# Patient Record
Sex: Male | Born: 1960 | Race: Black or African American | Hispanic: No | Marital: Married
Health system: Midwestern US, Community
[De-identification: ages and names within clinical notes are randomized; demographics above are authoritative.]

## PROBLEM LIST (undated history)

## (undated) DIAGNOSIS — Z1211 Encounter for screening for malignant neoplasm of colon: Secondary | ICD-10-CM

## (undated) DIAGNOSIS — Z7689 Persons encountering health services in other specified circumstances: Secondary | ICD-10-CM

## (undated) DIAGNOSIS — G4733 Obstructive sleep apnea (adult) (pediatric): Secondary | ICD-10-CM

---

## 2009-10-08 NOTE — Progress Notes (Signed)
HISTORY OF PRESENT ILLNESS  Alejandro Cohen is a 49 y.o. male.  HPI  Introductory visit  Wants physical not seen Doctor in 4.5 years  Past Medical History   Diagnosis Date   ??? Contact dermatitis and other eczema, due to unspecified cause      acne         Family History   Problem Relation Age of Onset   ??? Diabetes Mother    ??? Elevated Lipids Mother    ??? Migraines Mother    ??? Heart Disease Mother    ??? Hypertension Mother    ??? Psychiatric Disorder Mother    ??? Stroke Mother    ??? Alcohol abuse Father    ??? Diabetes Father    ??? Elevated Lipids Father    ??? Heart Disease Father    ??? Hypertension Father    ??? Stroke Father    ??? Alcohol abuse Maternal Grandmother    ??? Alcohol abuse Maternal Grandfather    ??? Alcohol abuse Paternal Grandmother    ??? Alcohol abuse Paternal Grandfather        History   Social History   ??? Marital Status: Divorced     Spouse Name: N/A     Number of Children: N/A   ??? Years of Education: N/A   Occupational History   ??? Not on file.   Social History Main Topics   ??? Smoking status: Former Smoker     Quit date: 10/09/1987   ??? Smokeless tobacco: Never Used   ??? Alcohol Use: No   ??? Drug Use: No   ??? Sexually Active: Not on file   Other Topics Concern   ??? Not on file   Social History Narrative   ??? No narrative on file     works out  [employer] well      No complaints  Review of Systems   Constitutional: Negative.  Negative for fever and malaise/fatigue.   HENT: Negative.    Eyes: Negative.    Respiratory: Negative for cough and shortness of breath.    Cardiovascular: Negative for chest pain, palpitations, orthopnea and leg swelling.   Gastrointestinal: Negative.  Negative for diarrhea.   Genitourinary: Negative.    Musculoskeletal: Negative.  Negative for myalgias.   Skin: Negative.    Neurological: Negative.  Negative for dizziness and tremors.   Endo/Heme/Allergies: Does not bruise/bleed easily.   All other systems reviewed and are negative.        Physical Exam   Vitals reviewed.   Constitutional: He is oriented to person, place, and time. He appears well-developed and well-nourished.   HENT:   Head: Normocephalic.   Eyes: Pupils are equal, round, and reactive to light.   Neck: Normal range of motion. Neck supple. No thyromegaly present.   Cardiovascular: Normal rate, regular rhythm, normal heart sounds and intact distal pulses.    No murmur heard.  Pulmonary/Chest: Effort normal and breath sounds normal. No respiratory distress. He has no wheezes. He exhibits no tenderness.   Abdominal: Soft. Bowel sounds are normal. He exhibits no mass.   Genitourinary: Prostate normal.   Musculoskeletal: He exhibits no edema and no tenderness.   Neurological: He is alert and oriented to person, place, and time. Coordination normal.   Skin: Skin is warm and dry.   Psychiatric: He has a normal mood and affect. His behavior is normal. Judgment and thought content normal.       ASSESSMENT and PLAN  Cary was seen today  for new patient.    Diagnoses and associated orders for this visit:    Elevated blood pressure reading without diagnosis of hypertension  - METABOLIC PANEL, COMPREHENSIVE; Future  - LIPID PANEL; Future  - CRP, HIGH SENSITIVITY; Future    General medical examination  - CBC WITH AUTOMATED DIFF; Future  - PROSTATE SPECIFIC AG; Future  - HIV 2 AB; Future  - CRP, HIGH SENSITIVITY; Future        DOING VERY WELL IN GOOD HEALTH, ADVISE CONTINUED HEALTHY LIFESTYLE  CONTINUE SCREENING TESTING AS ADVISED

## 2009-10-08 NOTE — Patient Instructions (Addendum)
English   Spanish  A Healthy Lifestyle: After Your Visit  Your Care Instructions  A healthy lifestyle can help you feel good, stay at a healthy weight, and have plenty of energy for both work and play. A healthy lifestyle is something you can share with your whole family.  A healthy lifestyle also can lower your risk for serious health problems, such as high blood pressure, heart disease, and diabetes.  You can follow a few steps listed below to improve your health and the health of your family.  Follow-up care is a key part of your treatment and safety. Be sure to make and go to all appointments, and call your doctor if you are having problems. It???s also a good idea to know your test results and keep a list of the medicines you take.  How can you care for yourself at home?  ?? Do not eat too much sugar, fat, or fast foods. You can still have dessert and treats now and then. The goal is moderation.   ?? Start small to improve your eating habits. Pay attention to portion sizes, drink less juice and soda pop, and eat more fruits and vegetables.   ?? Eat a healthy amount of food. A 3-ounce serving of meat, for example, is about the size of a deck of cards. Fill the rest of your plate with vegetables and whole grains.   ?? Limit the amount of soda and sports drinks you have every day. Drink more water when you are thirsty.   ?? Eat at least 5 servings of fruits and vegetables every day. It may seem like a lot, but it is not hard to reach this goal. A serving or helping is 1 piece of fruit, 1 cup of vegetables, or 2 cups of leafy, raw vegetables. Have an apple or some carrot sticks as an afternoon snack instead of a candy bar. Try to have fruits and/or vegetables at every meal.   ?? Make exercise part of your daily routine. You may want to start with simple activities, such as walking, bicycling, or slow swimming. Try to be active 30 to 60 minutes every day. You do not need to do all 30 to 60 minutes all at once. For example, you can exercise 3 times a day for 10 or 20 minutes. Moderate exercise is safe for most people, but it is always a good idea to talk to your doctor before starting an exercise program.   ?? Keep moving. Mow the lawn, work in the garden, or clean your house. Take the stairs instead of the elevator at work.   ?? If you smoke, quit. People who smoke have an increased risk for heart attack, stroke, cancer, and other lung illnesses. Quitting is hard, but there are ways to boost your chance of quitting tobacco for good.   ?? Use nicotine gum, patches, or lozenges.   ?? Ask your doctor about stop-smoking programs and medicines.   ?? Keep trying.  In addition to reducing your risk of diseases in the future, you will notice some benefits soon after you stop using tobacco. If you have shortness of breath or asthma symptoms, they will likely get better within a few weeks after you quit.   ?? Limit how much alcohol you drink. Moderate amounts of alcohol (up to 2 drinks a day for men, 1 drink a day for women) are okay. But drinking too much can lead to liver problems, high blood pressure, and other health problems.    Family health  If you have a family, there are many things you can do together to improve your health.  ?? Eat meals together as a family as often as possible.   ?? Eat healthy foods. This includes fruits, vegetables, lean meats and dairy, and whole grains.   ?? Include your family in your fitness plan. Most people think of activities such as jogging or tennis as the way to fitness, but there are many ways you and your family can be more active. Anything that makes you breathe hard and gets your heart pumping is exercise. Here are some tips:    ?? Walk to do errands or to take your child to school or the bus.   ?? Go for a family bike ride after dinner instead of watching TV.       Where can you learn more?   Go to [facility]  Enter [phone] 669-8395 in the search box to learn more about "A Healthy Lifestyle: After Your Visit."   ?? 2006-2011 [employer]. Care instructions adapted under license by [employer] (which disclaims liability or warranty for this information). This care instruction is for use with your licensed healthcare professional. If you have questions about a medical condition or this instruction, always ask your healthcare professional. Healthwise, Inc[employer] disclaims any warranty or liability for your use of this information.  Content Version: 8.9.83828; Last Revised: January 30, 2009      Colonoscopy Referral to Dr Patsi Sears, Daiva Huge, or Genevie Cheshire at [phone]  or  Call them and mention Dr Janace Aris referring  English   Spanish  The DASH Diet: After Your Visit  Your Care Instructions  The DASH diet is an eating plan that can help lower your blood pressure. DASH stands for Dietary Approaches to Stop Hypertension. Hypertension is high blood pressure.  The DASH diet focuses on eating foods that are high in calcium, potassium, and magnesium. These nutrients can lower blood pressure. The foods that are highest in these nutrients are fruits, vegetables, low-fat dairy products, nuts, seeds, and legumes. But taking calcium, potassium, and magnesium supplements instead of eating foods that are high in those nutrients does not have the same effect. The DASH diet also includes whole grains, fish, and poultry.  The DASH diet is one of several lifestyle changes your doctor may recommend to lower your high blood pressure. Your doctor may also want you to decrease the amount of sodium in your diet. Lowering sodium while following the DASH diet can lower blood pressure even further than just the DASH diet alone.   Follow-up care is a key part of your treatment and safety. Be sure to make and go to all appointments, and call your doctor if you are having problems. It???s also a good idea to know your test results and keep a list of the medicines you take.  How can you care for yourself at home?  Following the DASH diet  ?? Eat 4 to 5 servings of fruit each day. A serving is 1 medium-sized piece of fruit, 1/2 cup chopped or canned fruit, 1/4 cup dried fruit, or 4 ounces (1/2 cup) of fruit juice. Choose fruit more often than fruit juice.   ?? Eat 4 to 5 servings of vegetables each day. A serving is 1 cup of lettuce or raw leafy vegetables, 1/2 cup of chopped or cooked vegetables, or 4 ounces (1/2 cup) of vegetable juice. Choose vegetables more often than vegetable juice.   ?? Get 3 servings of  low-fat dairy each day. A serving is 8 ounces of milk, 1 cup of yogurt, or 1 1/2 ounces of cheese.   ?? Eat 7 to 8 servings of grains each day. A serving is 1 slice of bread, 1 ounce of dry cereal, or 1/2 cup of cooked rice, pasta, or cooked cereal. Try to choose whole-grain products as much as possible.   ?? Limit lean meat, poultry, and fish to 6 ounces each day. Six ounces is about the size of two decks of cards.   ?? Eat 4 to 5 servings of nuts, seeds, and legumes (cooked dried beans, lentils, and split peas) each week. A serving is 1/3 cup of nuts, 2 tablespoons of seeds, or 1/2 cup cooked dried beans or peas.  Tips for success  ?? Start small. Do not try to make dramatic changes to your diet all at once. You might feel that you are missing out on your favorite foods and then be more likely to not follow the plan. Make small changes, and stick with them. Once those changes become habit, add a few more changes.   ?? Try some of the following:   ?? Make it a goal to eat a fruit or vegetable at every meal and at snacks. This will make it easy to get the recommended amount of fruits and vegetables each day.    ?? Try yogurt topped with fruit and nuts for a snack or healthy dessert.   ?? Add lettuce, tomato, cucumber, and onion to sandwiches.   ?? Combine a ready-made pizza crust with low-fat mozzarella cheese and lots of vegetable toppings. Try using tomatoes, squash, spinach, broccoli, carrots, cauliflower, and onions.   ?? Have a variety of cut-up vegetables with a low-fat dip as an appetizer instead of chips and dip.   ?? Sprinkle sunflower seeds or chopped almonds over salads. Or try adding chopped walnuts or almonds to cooked vegetables.   ?? Try some vegetarian meals using beans and peas. Add garbanzo or kidney beans to salads. Make burritos and tacos with mashed pinto beans or black beans.     Where can you learn more?   Go to [facility]  Enter H967 in the search box to learn more about "The DASH Diet: After Your Visit."   ?? 2006-2011 Healthwise, Inc[employer]. Care instructions adapted under license by [employer] (which disclaims liability or warranty for this information). This care instruction is for use with your licensed healthcare professional. If you have questions about a medical condition or this instruction, always ask your healthcare professional. Healthwise, Inc[employer] disclaims any warranty or liability for your use of this information.  Content Version: 8.9.83828; Last Revised: July 10, 2008

## 2009-10-19 LAB — CBC WITH AUTOMATED DIFF
ABS. BASOPHILS: 0 10*3/uL (ref 0.0–0.2)
ABS. EOSINOPHILS: 0.2 10*3/uL (ref 0.0–0.4)
ABS. IMM. GRANS.: 0 10*3/uL (ref 0.0–0.1)
ABS. LYMPHOCYTES: 2.4 10*3/uL (ref 0.7–4.5)
ABS. MONOCYTES: 0.3 10*3/uL (ref 0.1–1.0)
ABS. NEUTROPHILS: 2.3 10*3/uL (ref 1.8–7.8)
BASOPHILS: 0 % (ref 0–3)
EOSINOPHILS: 3 % (ref 0–7)
HCT: 45.7 % (ref 36.0–50.0)
HGB: 15.7 g/dL (ref 12.5–17.0)
IMMATURE GRANULOCYTES: 0 % (ref 0–2)
LYMPHOCYTES: 46 % (ref 14–46)
MCH: 31.4 pg (ref 27.0–34.0)
MCHC: 34.4 g/dL (ref 32.0–36.0)
MCV: 91 fL (ref 80–98)
MONOCYTES: 7 % (ref 4–13)
NEUTROPHILS: 44 % (ref 40–74)
PLATELET: 192 10*3/uL (ref 140–415)
RBC: 5 x10E6/uL (ref 4.10–5.60)
RDW: 13.9 % (ref 11.7–15.0)
WBC: 5.2 10*3/uL (ref 4.0–10.5)

## 2009-10-19 LAB — METABOLIC PANEL, COMPREHENSIVE
A-G Ratio: 1.5 (ref 1.1–2.5)
ALT (SGPT): 36 IU/L (ref 0–55)
AST (SGOT): 20 IU/L (ref 0–40)
Albumin: 4.3 g/dL (ref 3.5–5.5)
Alk. phosphatase: 59 IU/L (ref 25–150)
BUN/Creatinine ratio: 17 (ref 9–20)
BUN: 19 mg/dL (ref 6–24)
Bilirubin, total: 0.3 mg/dL (ref 0.0–1.2)
CO2: 26 mmol/L (ref 20–32)
Calcium: 9.4 mg/dL (ref 8.7–10.2)
Chloride: 101 mmol/L (ref 97–108)
Creatinine: 1.13 mg/dL (ref 0.76–1.27)
GFR est AA: 90 mL/min/{1.73_m2} (ref >59)
GFR est non-AA: 78 mL/min/{1.73_m2} (ref >59)
GLOBULIN, TOTAL: 2.8 g/dL (ref 1.5–4.5)
Glucose: 95 mg/dL (ref 65–99)
Potassium: 4.2 mmol/L (ref 3.5–5.2)
Protein, total: 7.1 g/dL (ref 6.0–8.5)
Sodium: 138 mmol/L (ref 135–145)

## 2009-10-19 LAB — LIPID PANEL
Cholesterol, total: 200 mg/dL — ABNORMAL HIGH (ref 100–199)
HDL Cholesterol: 64 mg/dL (ref >39)
LDL, calculated: 118 mg/dL — ABNORMAL HIGH (ref 0–99)
Triglyceride: 91 mg/dL (ref 0–149)
VLDL, calculated: 18 mg/dL (ref 5–40)

## 2009-10-19 LAB — PSA, DIAGNOSTIC (PROSTATE SPECIFIC AG): Prostate Specific Ag: 0.5 ng/mL (ref 0.0–4.0)

## 2009-10-19 LAB — HIV 2 AB W/REFL IB
HIV-2 AB-O.D. RATIO, 163554: NEGATIVE (ref <1.00)
HIV-2 Ab-O.D. ratio: NEGATIVE (ref <1.00)

## 2009-10-19 LAB — CRP, HIGH SENSITIVITY: C-Reactive Protein, Cardiac: 1.31 mg/L (ref 0.00–3.00)

## 2010-05-11 NOTE — Telephone Encounter (Signed)
Spoke with pt who refuses consult. Notified Misty Stanley at [facility] office .

## 2013-03-09 ENCOUNTER — Ambulatory Visit
Admission: RE | Admit: 2013-03-09 | Discharge: 2013-03-09 | Disposition: A | Discharge status: Home / Self Care | Payer: BC Managed Care – PPO | Source: Ambulatory Visit | Attending: Internal Medicine | Admitting: Internal Medicine

## 2013-03-09 ENCOUNTER — Other Ambulatory Visit: Payer: Self-pay | Admitting: Internal Medicine

## 2013-03-09 DIAGNOSIS — M25512 Pain in left shoulder: Secondary | ICD-10-CM

## 2013-08-15 ENCOUNTER — Telehealth: Payer: Self-pay | Admitting: *Deleted

## 2013-08-15 NOTE — Telephone Encounter (Signed)
I would like to schedule surgery and schedule an appointment because I'm having pain as well.  I left him a message that he will need to call and schedule an appointment with Dr. Charlsie Merlesegal for a consultation then we can get him scheduled for surgery.

## 2013-08-16 ENCOUNTER — Encounter: Payer: Self-pay | Admitting: Podiatry

## 2013-08-16 ENCOUNTER — Ambulatory Visit (INDEPENDENT_AMBULATORY_CARE_PROVIDER_SITE_OTHER): Payer: BC Managed Care – PPO | Admitting: Podiatry

## 2013-08-16 ENCOUNTER — Ambulatory Visit (INDEPENDENT_AMBULATORY_CARE_PROVIDER_SITE_OTHER): Payer: BC Managed Care – PPO

## 2013-08-16 VITALS — BP 145/83 | HR 76 | Resp 16 | Ht 69.0 in | Wt 230.0 lb

## 2013-08-16 DIAGNOSIS — M21619 Bunion of unspecified foot: Secondary | ICD-10-CM

## 2013-08-16 DIAGNOSIS — M779 Enthesopathy, unspecified: Secondary | ICD-10-CM

## 2013-08-16 DIAGNOSIS — M21612 Bunion of left foot: Principal | ICD-10-CM

## 2013-08-16 DIAGNOSIS — M21611 Bunion of right foot: Secondary | ICD-10-CM

## 2013-08-16 DIAGNOSIS — M7660 Achilles tendinitis, unspecified leg: Secondary | ICD-10-CM

## 2013-08-16 MED ORDER — TRIAMCINOLONE ACETONIDE 10 MG/ML IJ SUSP: 10.0000 mg | Freq: Once | INTRAMUSCULAR | Status: AC

## 2013-08-16 NOTE — Progress Notes (Signed)
   Subjective:    Patient ID: Ross Lawrence, male    DOB: 04/06/1961, 53 y.o.   MRN: [id number]  HPI Comments: N  Bunions L  B/L 1st MPJ D prior to 09/2012 O C pain, change in function and movement A enclosed shoes and weightbearing T hx of evaluation in June by Dr Charlsie Merlesegal  Pt states pain in right achilles tendon, may be due to change in gait, because the right bunion is so painful.     Review of Systems  All other systems reviewed and are negative.      Objective:   Physical Exam        Assessment & Plan:

## 2013-08-16 NOTE — Progress Notes (Signed)
Subjective:      Patient ID: Ross Lawrence is a 53 y.o. male.  Chief Complaint: HPI Review of Systems  Unable to perform ROS     Objective:    Physical Exam  Constitutional: He appears healthy.  Musculoskeletal: Normal range of motion.  Neurological: He is oriented to person, place, and time.  Skin: Skin is warm.   neurovascular status found to be intact with hyperostosis medial aspect first metatarsal head right over left with redness and pain when pressed to the right 1 over left. Patient is found to have inflammation around the first MPJ with fluid buildup and also does have depression of the arch noted     Assessment:     Bilateral bunions - Plan: DG Foot Complete Right, DG Foot Complete Left  Enthesopathy of unspecified site - Plan: triamcinolone acetonide (KENALOG) 10 MG/ML injection 10 mg  Achilles bursitis or tendinitis   Plan:     Reviewed condition and he wants to have surgery in the fall. Today I injected the right first MPJ 3 mg Kenalog 5 mg Xylocaine Marcaine mixture and instructed on wider shoes and scanned for custom orthotics to reduce pressure against the joint surface. Reappoint to recheck

## 2013-08-16 NOTE — Patient Instructions (Signed)
Pre-Operative Instructions  Congratulations, you have decided to take an important step to improving your quality of life.  You can be assured that the doctors of [facility] will be with you every step of the way.  1. Plan to be at the surgery center/hospital at least 1 (one) hour prior to your scheduled time unless otherwise directed by the surgical center/hospital staff.  You must have a responsible adult accompany you, remain during the surgery and drive you home.  Make sure you have directions to the surgical center/hospital and know how to get there on time. 2. For hospital based surgery you will need to obtain a history and physical form from your family physician within 1 month prior to the date of surgery- we will give you a form for you primary physician.  3. We make every effort to accommodate the date you request for surgery.  There are however, times where surgery dates or times have to be moved.  We will contact you as soon as possible if a change in schedule is required.   4. No Aspirin/Ibuprofen for one week before surgery.  If you are on aspirin, any non-steroidal anti-inflammatory medications (Mobic, Aleve, Ibuprofen) you should stop taking it 7 days prior to your surgery.  You make take Tylenol  For pain prior to surgery.  5. Medications- If you are taking daily heart and blood pressure medications, seizure, reflux, allergy, asthma, anxiety, pain or diabetes medications, make sure the surgery center/hospital is aware before the day of surgery so they may notify you which medications to take or avoid the day of surgery. 6. No food or drink after midnight the night before surgery unless directed otherwise by surgical center/hospital staff. 7. No alcoholic beverages 24 hours prior to surgery.  No smoking 24 hours prior to or 24 hours after surgery. 8. Wear loose pants or shorts- loose enough to fit over bandages, boots, and casts. 9. No slip on shoes, sneakers are best. 10. Bring  your boot with you to the surgery center/hospital.  Also bring crutches or a walker if your physician has prescribed it for you.  If you do not have this equipment, it will be provided for you after surgery. 11. If you have not been contracted by the surgery center/hospital by the day before your surgery, call to confirm the date and time of your surgery. 12. Leave-time from work may vary depending on the type of surgery you have.  Appropriate arrangements should be made prior to surgery with your employer. 13. Prescriptions will be provided immediately following surgery by your doctor.  Have these filled as soon as possible after surgery and take the medication as directed. 14. Remove nail polish on the operative foot. 15. Wash the night before surgery.  The night before surgery wash the foot and leg well with the antibacterial soap provided and water paying special attention to beneath the toenails and in between the toes.  Rinse thoroughly with water and dry well with a towel.  Perform this wash unless told not to do so by your physician.  Enclosed: 1 Ice pack (please put in freezer the night before surgery)   1 Hibiclens skin cleaner   Pre-op Instructions  If you have any questions regarding the instructions, do not hesitate to call our office.  [place]: [address] [place], [place] [address] [phone]  [place]: [address] [place], [place] [address] [phone]  [place]: [address]  [place], [place] [address] [phone]  Dr. Richard   Tuchman DPM, Dr. Norman Regal DPM Dr. Richard Sikora DPM, Dr. M. Todd Hyatt DPM, Dr. Kathryn Egerton DPM 

## 2013-08-31 ENCOUNTER — Ambulatory Visit (INDEPENDENT_AMBULATORY_CARE_PROVIDER_SITE_OTHER): Payer: Self-pay | Admitting: *Deleted

## 2013-08-31 DIAGNOSIS — M7660 Achilles tendinitis, unspecified leg: Secondary | ICD-10-CM

## 2013-08-31 NOTE — Patient Instructions (Signed)

## 2013-08-31 NOTE — Progress Notes (Signed)
   Subjective:    Patient ID: Ross Lawrence, male    DOB: 08/13/1960, 53 y.o.   MRN: [id number]  HPIPICK UP ORTHOTICS AND GIVEN INSTRUCTION.   Review of Systems     Objective:   Physical Exam        Assessment & Plan:

## 2013-09-27 ENCOUNTER — Encounter: Payer: Self-pay | Admitting: Podiatry

## 2013-09-27 ENCOUNTER — Ambulatory Visit (INDEPENDENT_AMBULATORY_CARE_PROVIDER_SITE_OTHER): Payer: BC Managed Care – PPO | Admitting: Podiatry

## 2013-09-27 VITALS — BP 130/77 | HR 72 | Resp 15 | Ht 69.0 in | Wt 224.0 lb

## 2013-09-27 DIAGNOSIS — M779 Enthesopathy, unspecified: Secondary | ICD-10-CM

## 2013-09-27 DIAGNOSIS — M2010 Hallux valgus (acquired), unspecified foot: Secondary | ICD-10-CM

## 2013-09-27 NOTE — Progress Notes (Signed)
Subjective:     Patient ID: Ross Lawrence, male   DOB: 03-01-1961, 53 y.o.   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

## 2013-09-27 NOTE — Progress Notes (Signed)
   Subjective:    Patient ID: Ross Lawrence, male    DOB: 20-Feb-1961, 53 y.o.   MRN: [id number]  HPI Comments: Pt states overall the pains in his posterior heel and bunions have improved, due to the orthotics and the more ergonomic choice of shoes.  Foot Pain      Review of Systems     Objective:   Physical Exam        Assessment & Plan:

## 2013-12-10 DIAGNOSIS — M779 Enthesopathy, unspecified: Secondary | ICD-10-CM

## 2013-12-12 ENCOUNTER — Telehealth: Payer: Self-pay | Admitting: *Deleted

## 2013-12-12 NOTE — Telephone Encounter (Signed)
My surgery date is scheduled for November 23rd.  I need to change it to January 18th or 19th.  I had some changes on my job and can't be out of work at that time.  I do want to have the surgery.  I know I put it off before.  Let me know if this is possible.

## 2013-12-18 NOTE — Telephone Encounter (Signed)
I called and left him a message that we are rescheduling his surgery to 05/07/2014.  Call if you have any questions.  I informed Dr. Charlsie Merles as well as the surgical center.

## 2013-12-20 NOTE — Telephone Encounter (Signed)
I will be out of town the week of January 17-24th.  Please schedule for the next week

## 2013-12-26 ENCOUNTER — Telehealth: Payer: Self-pay | Admitting: *Deleted

## 2013-12-26 NOTE — Telephone Encounter (Signed)
I called and left him a message to call me back to reschedule his surgery.  Dr. Charlsie Merles will be out of the office the week of 05/07/2014.  We can reschedule the surgery to 04/30/2014 or 05/14/2014.  Please give me a call back.

## 2014-01-10 NOTE — Telephone Encounter (Signed)
I called and informed the patient that the week he chose to do surgery Dr. Charlsie Merles will be out of the office.  He stated, "I'm glad you called.  I need to be taken off the books completely.  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  Now I have left the company completely and I'm working at [employer].  So I can't go into this job asking for time off.  I will call you maybe sometime in October to get it back on the books but will play it by ear."  I told him that is fine, give [place] a call when you are ready.  He stated, "Thanks for calling."

## 2014-02-25 ENCOUNTER — Ambulatory Visit: Payer: BC Managed Care – PPO | Admitting: Podiatry

## 2014-03-18 ENCOUNTER — Encounter: Payer: BC Managed Care – PPO | Admitting: Podiatry

## 2014-04-01 ENCOUNTER — Encounter: Payer: BC Managed Care – PPO | Admitting: Podiatry

## 2014-04-01 NOTE — Progress Notes (Signed)
This encounter was created in error - please disregard.

## 2014-04-01 NOTE — Patient Instructions (Addendum)
M

## 2014-06-19 IMAGING — CR DG SHOULDER 2+V*L*
3 series · 3 of 3 positions shown · non-contrast
Comparison: None.

CLINICAL DATA: Left shoulder pain for 3 to 4 months. No known
injury.

EXAM:
LEFT SHOULDER - 2+ VIEW

[view not recorded (1 of 3)]
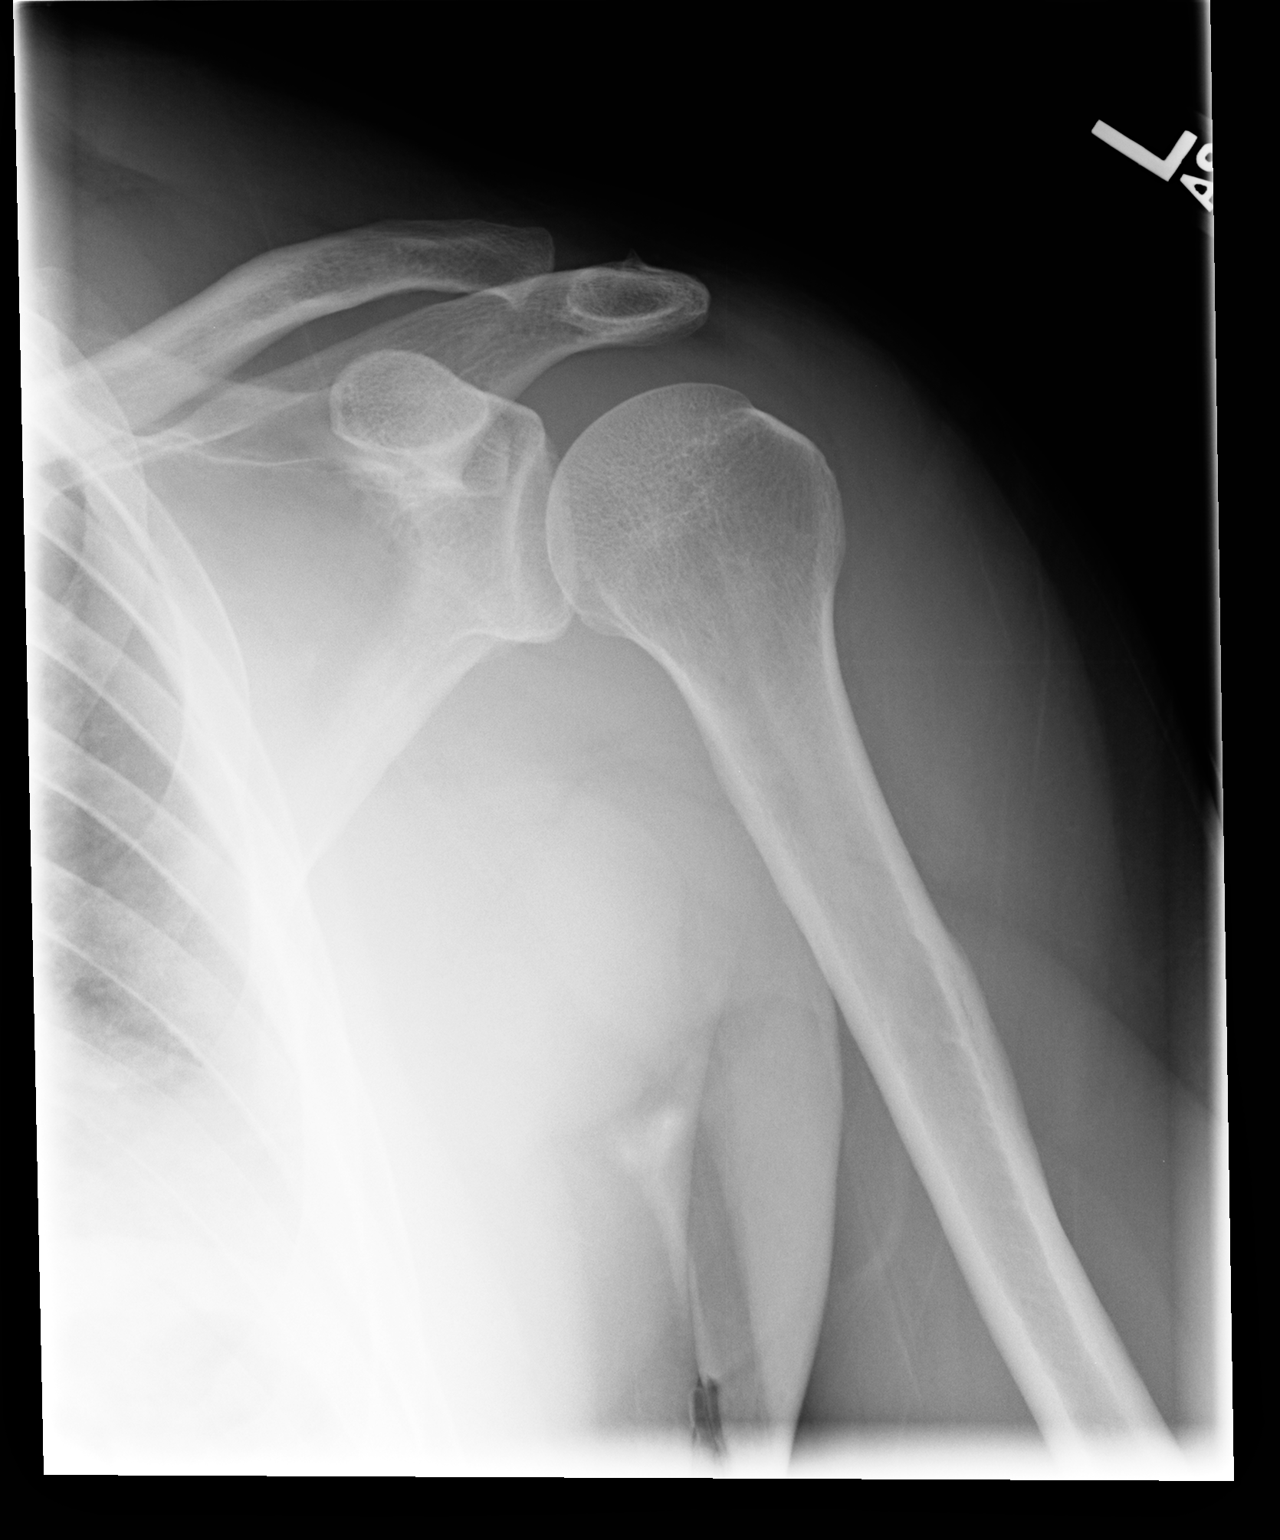

[view not recorded (2 of 3)]
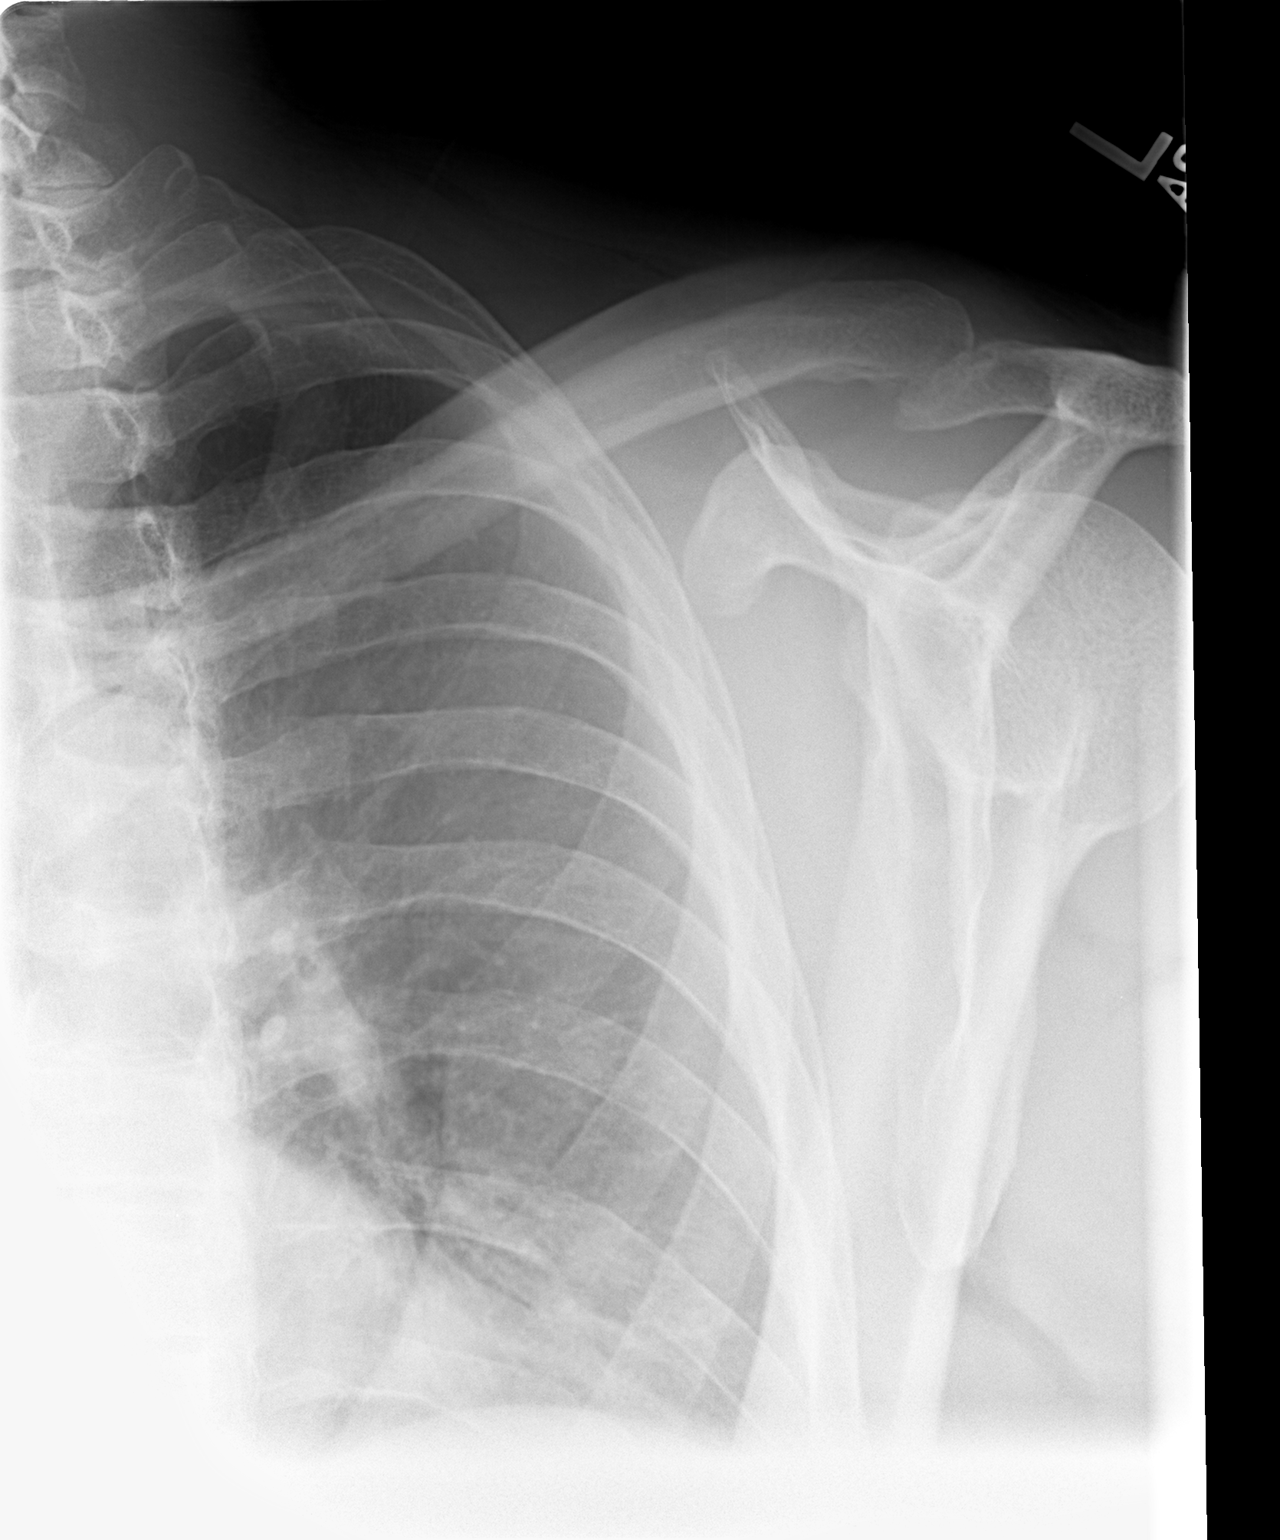

[view not recorded (3 of 3)]
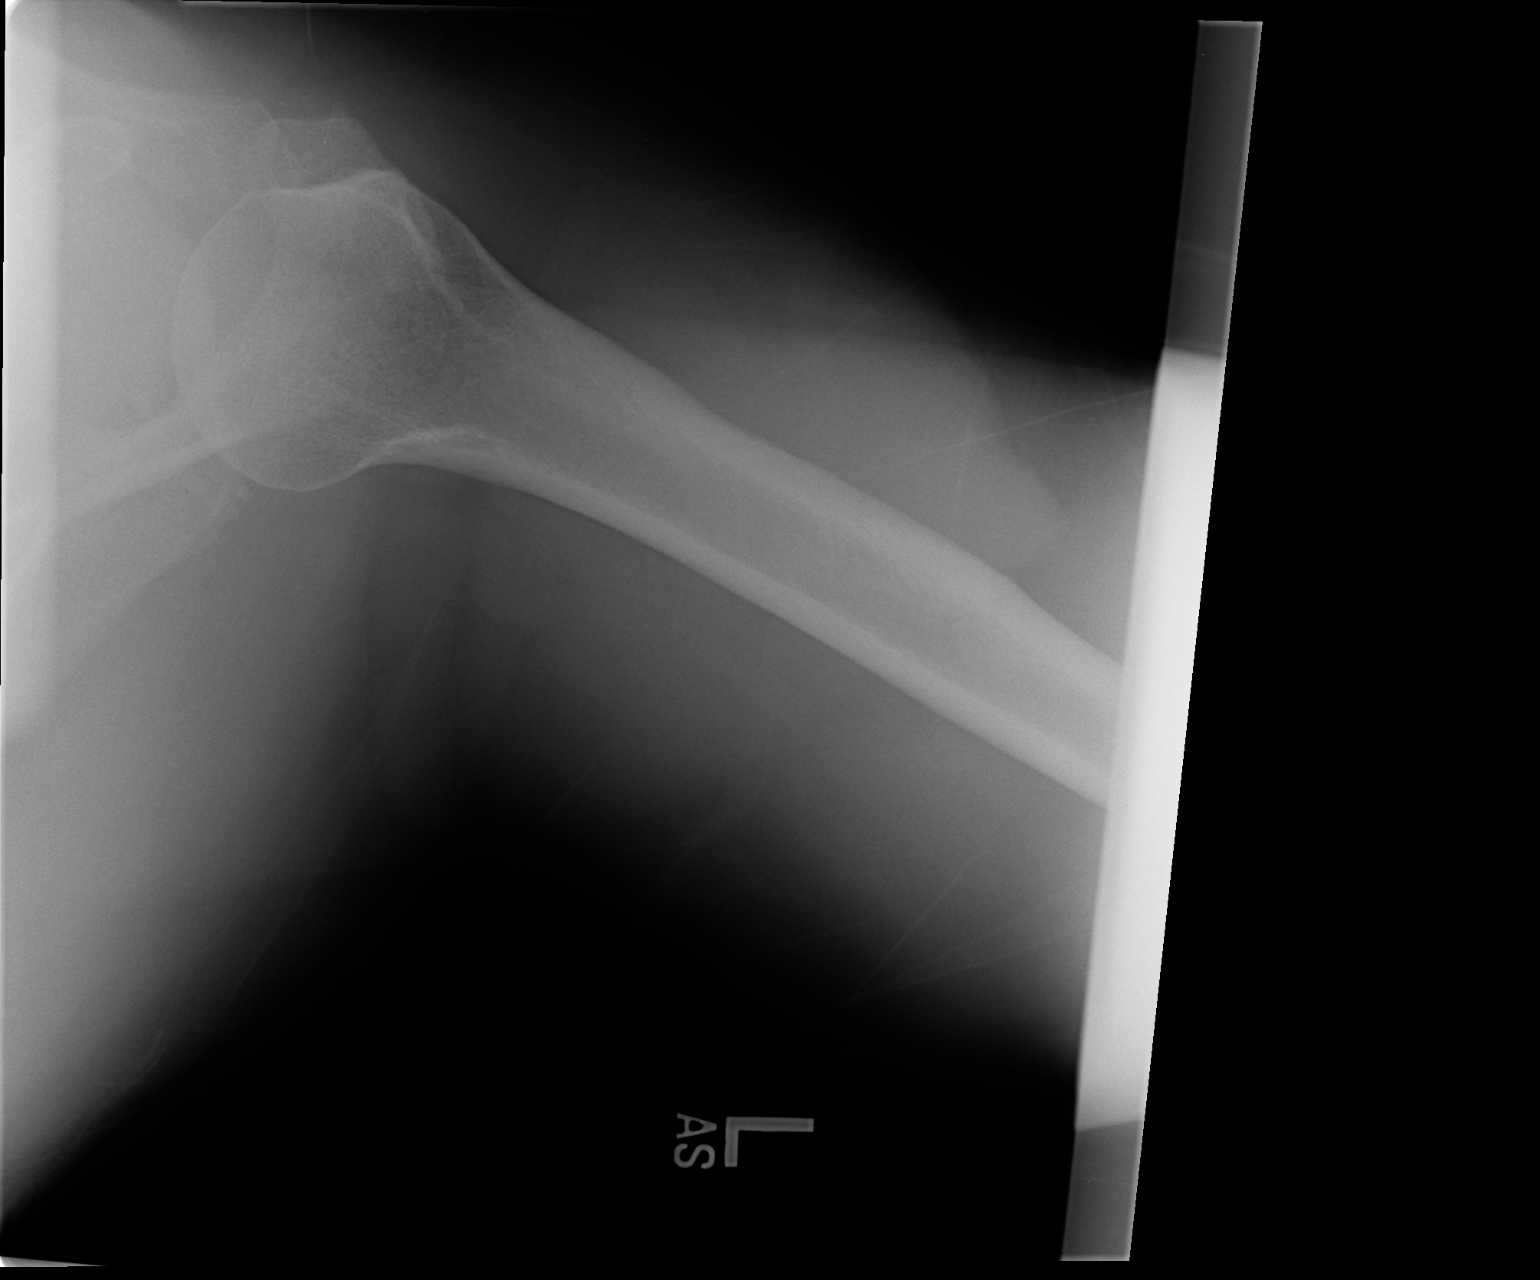

[3 of 3 positions shown; findings below may reference images not displayed]

FINDINGS: Curvature the anterior aspect of the chromium. Very mild
acromioclavicular joint degenerative changes.

No soft tissue calcifications.

No fracture or dislocation.

Visualized lungs clear.
IMPRESSION: Curvature the anterior aspect of the chromium. Very mild
acromioclavicular joint degenerative changes..

## 2014-09-30 ENCOUNTER — Ambulatory Visit (INDEPENDENT_AMBULATORY_CARE_PROVIDER_SITE_OTHER): Payer: BLUE CROSS/BLUE SHIELD

## 2014-09-30 ENCOUNTER — Ambulatory Visit (INDEPENDENT_AMBULATORY_CARE_PROVIDER_SITE_OTHER): Payer: BLUE CROSS/BLUE SHIELD | Admitting: Podiatry

## 2014-09-30 ENCOUNTER — Encounter: Payer: Self-pay | Admitting: Podiatry

## 2014-09-30 VITALS — BP 131/78 | HR 73 | Resp 11

## 2014-09-30 DIAGNOSIS — M2010 Hallux valgus (acquired), unspecified foot: Secondary | ICD-10-CM

## 2014-09-30 NOTE — Patient Instructions (Signed)
Pre-Operative Instructions  Congratulations, you have decided to take an important step to improving your quality of life.  You can be assured that the doctors of [facility] will be with you every step of the way.  1. Plan to be at the surgery center/hospital at least 1 (one) hour prior to your scheduled time unless otherwise directed by the surgical center/hospital staff.  You must have a responsible adult accompany you, remain during the surgery and drive you home.  Make sure you have directions to the surgical center/hospital and know how to get there on time. 2. For hospital based surgery you will need to obtain a history and physical form from your family physician within 1 month prior to the date of surgery- we will give you a form for you primary physician.  3. We make every effort to accommodate the date you request for surgery.  There are however, times where surgery dates or times have to be moved.  We will contact you as soon as possible if a change in schedule is required.   4. No Aspirin/Ibuprofen for one week before surgery.  If you are on aspirin, any non-steroidal anti-inflammatory medications (Mobic, Aleve, Ibuprofen) you should stop taking it 7 days prior to your surgery.  You make take Tylenol  For pain prior to surgery.  5. Medications- If you are taking daily heart and blood pressure medications, seizure, reflux, allergy, asthma, anxiety, pain or diabetes medications, make sure the surgery center/hospital is aware before the day of surgery so they may notify you which medications to take or avoid the day of surgery. 6. No food or drink after midnight the night before surgery unless directed otherwise by surgical center/hospital staff. 7. No alcoholic beverages 24 hours prior to surgery.  No smoking 24 hours prior to or 24 hours after surgery. 8. Wear loose pants or shorts- loose enough to fit over bandages, boots, and casts. 9. No slip on shoes, sneakers are best. 10. Bring  your boot with you to the surgery center/hospital.  Also bring crutches or a walker if your physician has prescribed it for you.  If you do not have this equipment, it will be provided for you after surgery. 11. If you have not been contracted by the surgery center/hospital by the day before your surgery, call to confirm the date and time of your surgery. 12. Leave-time from work may vary depending on the type of surgery you have.  Appropriate arrangements should be made prior to surgery with your employer. 13. Prescriptions will be provided immediately following surgery by your doctor.  Have these filled as soon as possible after surgery and take the medication as directed. 14. Remove nail polish on the operative foot. 15. Wash the night before surgery.  The night before surgery wash the foot and leg well with the antibacterial soap provided and water paying special attention to beneath the toenails and in between the toes.  Rinse thoroughly with water and dry well with a towel.  Perform this wash unless told not to do so by your physician.  Enclosed: 1 Ice pack (please put in freezer the night before surgery)   1 Hibiclens skin cleaner   Pre-op Instructions  If you have any questions regarding the instructions, do not hesitate to call our office.  [place]: [address] , [place] [address] [phone]  [place]: [address] [place], [place] [address] [phone]  [place]: [address]  [place], [place] [address] [phone]  Dr. Richard   Tuchman DPM, Dr. Norman Regal DPM Dr. Richard Sikora DPM, Dr. M. Todd Hyatt DPM, Dr. Kathryn Egerton DPM 

## 2014-10-01 NOTE — Progress Notes (Signed)
Subjective:     Patient ID: Ross Lawrence, male   DOB: 20-Nov-1960, 54 y.o.   MRN: [id number]  HPI patient states his right bunion is getting increasingly sore and making shoe gear and lifestyle difficult. States she's tried wider shoes she's tried soaks she's tried anti-inflammatory is without relief   Review of Systems  All other systems reviewed and are negative.      Objective:   Physical Exam  Constitutional: He is oriented to person, place, and time.  Cardiovascular: Intact distal pulses.   Musculoskeletal: Normal range of motion.  Neurological: He is oriented to person, place, and time.  Skin: Skin is warm.  Nursing note and vitals reviewed.  neurovascular status intact muscle strength adequate with hyperostosis and pain and redness medial aspect first metatarsal head right with deviation of the hallux against the second toe and has pain when palpated     Assessment:     Structural bunion deformity right over left with symptomatic pain noted    Plan:     H&P and x-rays of both feet reviewed with patient. Today I recommended structural correction of the bunion with Austin-type osteotomy due to elevation of angle of 15 and I explained procedure to patient. Patient wants procedure and I allowed him to read consent form reviewing alternative treatments and complications of this procedure. Patient signed consent form after review and is scheduled for outpatient surgery [facility] center. He is given instructions to call us if he should have any questions and is dispensed air fracture walker at this time and does understand total recovery. Can take 6 months to one year

## 2014-10-15 DIAGNOSIS — M2011 Hallux valgus (acquired), right foot: Secondary | ICD-10-CM | POA: Diagnosis not present

## 2014-10-18 ENCOUNTER — Telehealth: Payer: Self-pay | Admitting: *Deleted

## 2014-10-18 NOTE — Telephone Encounter (Addendum)
I'm calling to see how you're doing.  "I'm doing good."  Is the pain medication doing okay for you.  "Yes, it's good."  Great, make sure you elevate your foot as much as possible.  Take pain medication as prescribed.  Call the on-call pager if you have any questions or concerns over the weekend.  You were scheduled incorrectly for your follow-up appointment.  I'm going to change it to 4:15pm.  Is that okay?  "Yes, that will be fine."

## 2014-10-22 ENCOUNTER — Ambulatory Visit (INDEPENDENT_AMBULATORY_CARE_PROVIDER_SITE_OTHER): Payer: BLUE CROSS/BLUE SHIELD | Admitting: Podiatry

## 2014-10-22 ENCOUNTER — Other Ambulatory Visit: Payer: BLUE CROSS/BLUE SHIELD

## 2014-10-22 ENCOUNTER — Ambulatory Visit (INDEPENDENT_AMBULATORY_CARE_PROVIDER_SITE_OTHER): Payer: BLUE CROSS/BLUE SHIELD

## 2014-10-22 ENCOUNTER — Encounter: Payer: Self-pay | Admitting: Podiatry

## 2014-10-22 VITALS — BP 137/96 | HR 76 | Resp 12

## 2014-10-22 DIAGNOSIS — Z9889 Other specified postprocedural states: Secondary | ICD-10-CM | POA: Diagnosis not present

## 2014-10-22 DIAGNOSIS — M2010 Hallux valgus (acquired), unspecified foot: Secondary | ICD-10-CM

## 2014-10-23 NOTE — Progress Notes (Signed)
Subjective:     Patient ID: Ross Lawrence, male   DOB: 01/22/1961, 54 y.o.   MRN: [id number]  HPI patient states I'm doing real well with my right foot with a significant diminishment of discomfort with first MPJ range of motion 30 of dorsiflexion 20 of plantarflexion with no pain or no crepitus noted and no significant swelling when I'm weightbearing   Review of Systems     Objective:   Physical Exam Neurovascular status intact muscle strength adequate negative Homans sign noted with well-healing surgical site first MPJ right with wound edges well coapted and good range of motion with 30 of dorsiflexion 20 of plantarflexion    Assessment:     Doing well post foot reconstruction right    Plan:     Reviewed x-rays and explained continued immobilization and do not walk on this foot without boot for surgical shoe. I explained there slight stress on the osteotomy site but I do think it'll heal uneventfully but I want him to be careful for at least another 4 weeks and I also advised on range of motion exercises. Sterile dressing reapplied and importance of compression elevation and immobilization was reinforced to the patient

## 2014-11-25 ENCOUNTER — Encounter: Payer: Self-pay | Admitting: Podiatry

## 2014-11-25 ENCOUNTER — Ambulatory Visit (INDEPENDENT_AMBULATORY_CARE_PROVIDER_SITE_OTHER): Payer: BLUE CROSS/BLUE SHIELD

## 2014-11-25 ENCOUNTER — Other Ambulatory Visit: Payer: BLUE CROSS/BLUE SHIELD

## 2014-11-25 ENCOUNTER — Ambulatory Visit (INDEPENDENT_AMBULATORY_CARE_PROVIDER_SITE_OTHER): Payer: BLUE CROSS/BLUE SHIELD | Admitting: Podiatry

## 2014-11-25 VITALS — BP 140/73 | HR 69 | Resp 16

## 2014-11-25 DIAGNOSIS — Z9889 Other specified postprocedural states: Secondary | ICD-10-CM

## 2014-11-25 DIAGNOSIS — M2010 Hallux valgus (acquired), unspecified foot: Secondary | ICD-10-CM

## 2014-11-26 NOTE — Progress Notes (Signed)
Subjective:     Patient ID: Ross Lawrence, male   DOB: 06-13-1960, 54 y.o.   MRN: [id number]  HPI patient states I'm doing well with my right foot and I been able to wear soft shoes and I am moving to [facility]   Review of Systems     Objective:   Physical Exam  neurovascular status intact muscle strength adequate with well coapted incision site first metatarsal and good range of motion with 35 dorsiflexion 30 plantarflexion    Assessment:      doing well post Austin-type osteotomy right    Plan:      x-ray reviewed and at this time patient is to returning to normal activities and will be seen back as needed. Patient has done well

## 2015-08-18 ENCOUNTER — Encounter

## 2015-08-18 ENCOUNTER — Ambulatory Visit
Admit: 2015-08-18 | Discharge: 2015-08-18 | Payer: PRIVATE HEALTH INSURANCE | Attending: Internal Medicine | Primary: Internal Medicine

## 2015-08-18 DIAGNOSIS — E7800 Pure hypercholesterolemia, unspecified: Secondary | ICD-10-CM

## 2015-08-18 LAB — AMB POC LIPID PROFILE
Cholesterol (POC): 231
HDL Cholesterol (POC): 56
LDL Cholesterol (POC): 152
Non-HDL Goal (POC): 175
TChol/HDL Ratio (POC): 4.1
Triglycerides (POC): 116

## 2015-08-18 LAB — AMB POC GLUCOSE BLOOD, BY GLUCOSE MONITORING DEVICE: Glucose POC: 105 mg/dL

## 2015-08-18 LAB — AMB POC HEMOGLOBIN A1C: Hemoglobin A1c (POC): 6 %

## 2015-08-18 MED ORDER — ATORVASTATIN 40 MG TAB
40 mg | ORAL_TABLET | Freq: Every day | ORAL | 12 refills | Status: DC
Start: 2015-08-18 — End: 2015-11-19

## 2015-08-18 NOTE — Progress Notes (Signed)
Chief Complaint   Patient presents with   ??? Establish Care

## 2015-08-18 NOTE — Patient Instructions (Addendum)
MyChart Activation    Thank you for requesting access to MyChart. Please follow the instructions below to securely access and download your online medical record. MyChart allows you to send messages to your doctor, view your test results, renew your prescriptions, schedule appointments, and more.    How Do I Sign Up?    1. In your internet browser, go to [email or web address]  2. Click on the First Time User? Click Here link in the Sign In box. You will be redirect to the New Member Sign Up page.  3. Enter your MyChart Access Code exactly as it appears below. You will not need to use this code after you???ve completed the sign-up process. If you do not sign up before the expiration date, you must request a new code.    MyChart Access Code: Activation code not generated  Current MyChart Status: Active (This is the date your MyChart access code will expire)    4. Enter the last four digits of your Social Security Number (xxxx) and Date of Birth (mm/dd/yyyy) as indicated and click Submit. You will be taken to the next sign-up page.  5. Create a MyChart ID. This will be your MyChart login ID and cannot be changed, so think of one that is secure and easy to remember.  6. Create a MyChart password. You can change your password at any time.  7. Enter your Password Reset Question and Answer. This can be used at a later time if you forget your password.   8. Enter your e-mail address. You will receive e-mail notification when new information is available in [place].  9. Click Sign Up. You can now view and download portions of your medical record.  10. Click the Download Summary menu link to download a portable copy of your medical information.    Additional Information    If you have questions, please visit the Frequently Asked Questions section of the MyChart website at [email or web address] Remember, MyChart is NOT to be used for urgent needs. For medical emergencies, dial 911.            DASH Diet: Care Instructions  Your Care Instructions  The DASH diet is an eating plan that can help lower your blood pressure. DASH stands for Dietary Approaches to Stop Hypertension. Hypertension is high blood pressure.  The DASH diet focuses on eating foods that are high in calcium, potassium, and magnesium. These nutrients can lower blood pressure. The foods that are highest in these nutrients are fruits, vegetables, low-fat dairy products, nuts, seeds, and legumes. But taking calcium, potassium, and magnesium supplements instead of eating foods that are high in those nutrients does not have the same effect. The DASH diet also includes whole grains, fish, and poultry.  The DASH diet is one of several lifestyle changes your doctor may recommend to lower your high blood pressure. Your doctor may also want you to decrease the amount of sodium in your diet. Lowering sodium while following the DASH diet can lower blood pressure even further than just the DASH diet alone.  Follow-up care is a key part of your treatment and safety. Be sure to make and go to all appointments, and call your doctor if you are having problems. It's also a good idea to know your test results and keep a list of the medicines you take.  How can you care for yourself at home?  Following the DASH diet  ?? Eat 4 to 5 servings of fruit each day. A serving is  1 medium-sized piece of fruit, ?? cup chopped or canned fruit, 1/4 cup dried fruit, or 4 ounces (?? cup) of fruit juice. Choose fruit more often than fruit juice.  ?? Eat 4 to 5 servings of vegetables each day. A serving is 1 cup of lettuce or raw leafy vegetables, ?? cup of chopped or cooked vegetables, or 4 ounces (?? cup) of vegetable juice. Choose vegetables more often than vegetable juice.  ?? Get 2 to 3 servings of low-fat and fat-free dairy each day. A serving is 8 ounces of milk, 1 cup of yogurt, or 1 ?? ounces of cheese.   ?? Eat 6 to 8 servings of grains each day. A serving is 1 slice of bread, 1 ounce of dry cereal, or ?? cup of cooked rice, pasta, or cooked cereal. Try to choose whole-grain products as much as possible.  ?? Limit lean meat, poultry, and fish to 2 servings each day. A serving is 3 ounces, about the size of a deck of cards.  ?? Eat 4 to 5 servings of nuts, seeds, and legumes (cooked dried beans, lentils, and split peas) each week. A serving is 1/3 cup of nuts, 2 tablespoons of seeds, or ?? cup of cooked beans or peas.  ?? Limit fats and oils to 2 to 3 servings each day. A serving is 1 teaspoon of vegetable oil or 2 tablespoons of salad dressing.  ?? Limit sweets and added sugars to 5 servings or less a week. A serving is 1 tablespoon jelly or jam, ?? cup sorbet, or 1 cup of lemonade.  ?? Eat less than 2,300 milligrams (mg) of sodium a day. If you limit your sodium to 1,500 mg a day, you can lower your blood pressure even more.  Tips for success  ?? Start small. Do not try to make dramatic changes to your diet all at once. You might feel that you are missing out on your favorite foods and then be more likely to not follow the plan. Make small changes, and stick with them. Once those changes become habit, add a few more changes.  ?? Try some of the following:  ?? Make it a goal to eat a fruit or vegetable at every meal and at snacks. This will make it easy to get the recommended amount of fruits and vegetables each day.  ?? Try yogurt topped with fruit and nuts for a snack or healthy dessert.  ?? Add lettuce, tomato, cucumber, and onion to sandwiches.  ?? Combine a ready-made pizza crust with low-fat mozzarella cheese and lots of vegetable toppings. Try using tomatoes, squash, spinach, broccoli, carrots, cauliflower, and onions.  ?? Have a variety of cut-up vegetables with a low-fat dip as an appetizer instead of chips and dip.  ?? Sprinkle sunflower seeds or chopped almonds over salads. Or try adding  chopped walnuts or almonds to cooked vegetables.  ?? Try some vegetarian meals using beans and peas. Add garbanzo or kidney beans to salads. Make burritos and tacos with mashed pinto beans or black beans.  Where can you learn more?  Go to http://www.healthwise.net/GoodHelpConnections.  Enter H967 in the search box to learn more about "DASH Diet: Care Instructions."  Current as of: July 10, 2014  Content Version: 11.2  ?? 2006-2017 [employer]. Care instructions adapted under license by [employer] (which disclaims liability or warranty for this information). If you have questions about a medical condition or this instruction, always ask your healthcare professional. [employer]wise, Incorporated disclaims any   warranty or liability for your use of this information.       Learning About High Cholesterol  What is high cholesterol?  Cholesterol is a type of fat in your blood. It is needed for many body functions, such as making new cells. Cholesterol is made by your body. It also comes from food you eat.  If you have too much cholesterol, it starts to build up in your arteries. This is called hardening of the arteries, or atherosclerosis. High cholesterol raises your risk of a heart attack and stroke.  There are different types of cholesterol. LDL is the "bad" cholesterol. High LDL can raise your risk for heart disease, heart attack, and stroke. HDL is the "good" cholesterol. High HDL is linked with a lower risk for heart disease, heart attack, and stroke.  Your cholesterol levels help your doctor find out your risk for having a heart attack or stroke.  How can you prevent high cholesterol?  A heart-healthy lifestyle can help you prevent high cholesterol. This lifestyle helps lower your risk for a heart attack and stroke.  ?? Eat heart-healthy foods.  ?? Eat fruits, vegetables, whole grains (like oatmeal), dried beans and peas, nuts and seeds, soy products (like tofu), and fat-free or low-fat  dairy products.  ?? Replace butter, margarine, and hydrogenated or partially hydrogenated oils with olive and canola oils. (Canola oil margarine without trans fat is fine.)  ?? Replace red meat with fish, poultry, and soy protein (like tofu).  ?? Limit processed and packaged foods like chips, crackers, and cookies.  ?? Be active. Exercise can improve your cholesterol level. Get at least 30 minutes of exercise on most days of the week. Walking is a good choice. You also may want to do other activities, such as running, swimming, cycling, or playing tennis or team sports.  ?? Stay at a healthy weight. Lose weight if you need to.  ?? Don't smoke. If you need help quitting, talk to your doctor about stop-smoking programs and medicines. These can increase your chances of quitting for good.  How is high cholesterol treated?  The goal of treatment is to reduce your chances of having a heart attack or stroke. The goal is not to lower your cholesterol numbers only.  ?? You may make lifestyle changes, such as eating healthy foods, not smoking, losing weight, and being more active.  ?? You may have to take medicine.  Follow-up care is a key part of your treatment and safety. Be sure to make and go to all appointments, and call your doctor if you are having problems. It's also a good idea to know your test results and keep a list of the medicines you take.  Where can you learn more?  Go to [employer]cahttp://www.healthwise.net/GoodHelpConnections.  Enter (404)637-7922Q621 in the search box to learn more about "Learning About High Cholesterol."  Current as of: May 15, 2014  Content Version: 11.2  ?? 2006-2017 [employer]. Care instructions adapted under license by [employer] (which disclaims liability or warranty for this information). If you have questions about a medical condition or this instruction, always ask your healthcare professional. [employer] Inc[employer] disclaims any warranty or liability for your use of this  information.

## 2015-08-18 NOTE — Progress Notes (Signed)
Alejandro Cohen is a 55 y.o. male and presents with Establish Care  .  Subjective:  The patient presents for a well male evaluation.  He offers no new problems today.      Review of Systems  Constitutional: negative for fevers, chills, anorexia and weight loss  Eyes:   negative for visual disturbance and irritation  ENT:   negative for tinnitus,sore throat,nasal congestion,ear pains.hoarseness  Respiratory:  negative for cough, hemoptysis, dyspnea,wheezing  CV:   negative for chest pain, palpitations, lower extremity edema  GI:   negative for nausea, vomiting, diarrhea, abdominal pain,melena  Endo:               negative for polyuria,polydipsia,polyphagia,heat intolerance  Genitourinary: negative for frequency, dysuria and hematuria  Integument:  negative for rash and pruritus  Hematologic:  negative for easy bruising and gum/nose bleeding  Musculoskel: negative for myalgias, arthralgias, back pain, muscle weakness, joint pain  Neurological:  negative for headaches, dizziness, vertigo, memory problems and gait   Behavl/Psych: negative for feelings of anxiety, depression, mood changes    Past Medical History:   Diagnosis Date   ??? Contact dermatitis and other eczema, due to unspecified cause     acne     History reviewed. No pertinent surgical history.  Social History     Social History   ??? Marital status: MARRIED     Spouse name: N/A   ??? Number of children: N/A   ??? Years of education: N/A     Social History Main Topics   ??? Smoking status: Former Smoker     Quit date: 10/09/1987   ??? Smokeless tobacco: Never Used   ??? Alcohol use No   ??? Drug use: No   ??? Sexual activity: Yes     Partners: Female     Birth control/ protection: None     Other Topics Concern   ??? None     Social History Narrative     Family History   Problem Relation Age of Onset   ??? Diabetes Mother    ??? Elevated Lipids Mother    ??? Migraines Mother    ??? Heart Disease Mother    ??? Hypertension Mother    ??? Psychiatric Disorder Mother    ??? Stroke Mother     ??? Alcohol abuse Father    ??? Diabetes Father    ??? Elevated Lipids Father    ??? Heart Disease Father    ??? Hypertension Father    ??? Stroke Father    ??? Alcohol abuse Maternal Grandmother    ??? Alcohol abuse Maternal Grandfather    ??? Alcohol abuse Paternal Grandmother    ??? Alcohol abuse Paternal Grandfather      Current Outpatient Prescriptions   Medication Sig Dispense Refill   ??? aspirin delayed-release 81 mg tablet Take  by mouth daily.     ??? multivitamin (ONE A DAY) tablet Take 1 Tab by mouth daily.     ??? Omega-3 Fatty Acids (FISH OIL) 300 mg cap Take  by mouth.     ??? atorvastatin (LIPITOR) 40 mg tablet Take 1 Tab by mouth daily. 30 Tab 12     No Known Allergies    Objective:  Visit Vitals   ??? BP 112/70 (BP 1 Location: Left arm, BP Patient Position: Sitting)   ??? Pulse 72   ??? Temp 97.7 ??F (36.5 ??C) (Oral)   ??? Resp 18   ??? Ht 5\' 9"  (1.753 m)   ???  Wt 242 lb 12.8 oz (110.1 kg)   ??? SpO2 93%   ??? BMI 35.86 kg/m2     Physical Exam:   General appearance - alert, well appearing, and in no distress  Mental status - alert, oriented to person, place, and time  EYE-PERLA, EOMI, corneas normal, no foreign bodies  ENT-ENT exam normal, no neck nodes or sinus tenderness  Nose - normal and patent, no erythema, discharge or polyps  Mouth - mucous membranes moist, pharynx normal without lesions  Neck - supple, no significant adenopathy   Chest - clear to auscultation, no wheezes, rales or rhonchi, symmetric air entry   Heart - normal rate, regular rhythm, normal S1, S2, no murmurs, rubs, clicks or gallops   Abdomen - soft, nontender, nondistended, no masses or organomegaly  Lymph- no adenopathy palpable  Ext-peripheral pulses normal, no pedal edema, no clubbing or cyanosis  Skin-Warm and dry. no hyperpigmentation, vitiligo, or suspicious lesions  Neuro -alert, oriented, normal speech, no focal findings or movement disorder noted  Neck-normal C-spine, no tenderness, full ROM without pain   GU Male exam: no penile lesions or discharge, no testicular masses or tenderness, no hernias    Results for orders placed or performed in visit on 08/18/15   AMB POC LIPID PROFILE   Result Value Ref Range    Cholesterol (POC) 231     Triglycerides (POC) 116     HDL Cholesterol (POC) 56     LDL Cholesterol (POC) 152     Non-HDL Goal (POC) 175     TChol/HDL Ratio (POC) 4.1    AMB POC GLUCOSE BLOOD, BY GLUCOSE MONITORING DEVICE   Result Value Ref Range    Glucose POC 105 mg/dL   AMB POC HEMOGLOBIN Z6X   Result Value Ref Range    Hemoglobin A1c (POC) 6.0 %       Assessment/Plan:    ICD-10-CM ICD-9-CM    1. Hypercholesteremia E78.00 272.0    2. Establishing care with new doctor, encounter for Z71.89 V65.8 AMB POC LIPID PROFILE      AMB POC GLUCOSE BLOOD, BY GLUCOSE MONITORING DEVICE      AMB POC HEMOGLOBIN A1C      CBC W/O DIFF      METABOLIC PANEL, COMPREHENSIVE   3. Need for hepatitis C screening test Z11.59 V73.89 PR HEPATITIS C SCREENING DOCUMENTED AS PERFORMED   4. Screen for colon cancer Z12.11 V76.51 OCCULT BLOOD, IMMUNOASSAY (FIT)   5. Frequency of micturition R35.0 788.41 PROSTATE SPECIFIC AG (PSA)     Orders Placed This Encounter   ??? CBC W/O DIFF   ??? METABOLIC PANEL, COMPREHENSIVE   ??? OCCULT BLOOD, IMMUNOASSAY (FIT)   ??? PROSTATE SPECIFIC AG   ??? AMB POC LIPID PROFILE   ??? AMB POC GLUCOSE BLOOD, BY GLUCOSE MONITORING DEVICE   ??? AMB POC HEMOGLOBIN A1C   ??? PR HEPATITIS C SCREENING DOCUMENTED AS PERFORMED   ??? aspirin delayed-release 81 mg tablet     Sig: Take  by mouth daily.   ??? multivitamin (ONE A DAY) tablet     Sig: Take 1 Tab by mouth daily.   ??? Omega-3 Fatty Acids (FISH OIL) 300 mg cap     Sig: Take  by mouth.   ??? atorvastatin (LIPITOR) 40 mg tablet     Sig: Take 1 Tab by mouth daily.     Dispense:  30 Tab     Refill:  12     lose weight, increase physical activity,  routine labs ordered  Patient Instructions   MyChart Activation    Thank you for requesting access to MyChart. Please follow the instructions  below to securely access and download your online medical record. MyChart allows you to send messages to your doctor, view your test results, renew your prescriptions, schedule appointments, and more.    How Do I Sign Up?    1. In your internet browser, go to [email or web address]  2. Click on the First Time User? Click Here link in the Sign In box. You will be redirect to the New Member Sign Up page.  3. Enter your MyChart Access Code exactly as it appears below. You will not need to use this code after you???ve completed the sign-up process. If you do not sign up before the expiration date, you must request a new code.    MyChart Access Code: Activation code not generated  Current MyChart Status: Active (This is the date your MyChart access code will expire)    4. Enter the last four digits of your Social Security Number (xxxx) and Date of Birth (mm/dd/yyyy) as indicated and click Submit. You will be taken to the next sign-up page.  5. Create a MyChart ID. This will be your MyChart login ID and cannot be changed, so think of one that is secure and easy to remember.  6. Create a MyChart password. You can change your password at any time.  7. Enter your Password Reset Question and Answer. This can be used at a later time if you forget your password.   8. Enter your e-mail address. You will receive e-mail notification when new information is available in MyChart.  9. Click Sign Up. You can now view and download portions of your medical record.  10. Click the Download Summary menu link to download a portable copy of your medical information.    Additional Information    If you have questions, please visit the Frequently Asked Questions section of the MyChart website at [email or web address] Remember, MyChart is NOT to be used for urgent needs. For medical emergencies, dial 911.           DASH Diet: Care Instructions  Your Care Instructions   The DASH diet is an eating plan that can help lower your blood pressure. DASH stands for Dietary Approaches to Stop Hypertension. Hypertension is high blood pressure.  The DASH diet focuses on eating foods that are high in calcium, potassium, and magnesium. These nutrients can lower blood pressure. The foods that are highest in these nutrients are fruits, vegetables, low-fat dairy products, nuts, seeds, and legumes. But taking calcium, potassium, and magnesium supplements instead of eating foods that are high in those nutrients does not have the same effect. The DASH diet also includes whole grains, fish, and poultry.  The DASH diet is one of several lifestyle changes your doctor may recommend to lower your high blood pressure. Your doctor may also want you to decrease the amount of sodium in your diet. Lowering sodium while following the DASH diet can lower blood pressure even further than just the DASH diet alone.  Follow-up care is a key part of your treatment and safety. Be sure to make and go to all appointments, and call your doctor if you are having problems. It's also a good idea to know your test results and keep a list of the medicines you take.  How can you care for yourself at home?  Following the DASH diet  ?? Eat 4 to  5 servings of fruit each day. A serving is 1 medium-sized piece of fruit, ?? cup chopped or canned fruit, 1/4 cup dried fruit, or 4 ounces (?? cup) of fruit juice. Choose fruit more often than fruit juice.  ?? Eat 4 to 5 servings of vegetables each day. A serving is 1 cup of lettuce or raw leafy vegetables, ?? cup of chopped or cooked vegetables, or 4 ounces (?? cup) of vegetable juice. Choose vegetables more often than vegetable juice.  ?? Get 2 to 3 servings of low-fat and fat-free dairy each day. A serving is 8 ounces of milk, 1 cup of yogurt, or 1 ?? ounces of cheese.  ?? Eat 6 to 8 servings of grains each day. A serving is 1 slice of bread, 1  ounce of dry cereal, or ?? cup of cooked rice, pasta, or cooked cereal. Try to choose whole-grain products as much as possible.  ?? Limit lean meat, poultry, and fish to 2 servings each day. A serving is 3 ounces, about the size of a deck of cards.  ?? Eat 4 to 5 servings of nuts, seeds, and legumes (cooked dried beans, lentils, and split peas) each week. A serving is 1/3 cup of nuts, 2 tablespoons of seeds, or ?? cup of cooked beans or peas.  ?? Limit fats and oils to 2 to 3 servings each day. A serving is 1 teaspoon of vegetable oil or 2 tablespoons of salad dressing.  ?? Limit sweets and added sugars to 5 servings or less a week. A serving is 1 tablespoon jelly or jam, ?? cup sorbet, or 1 cup of lemonade.  ?? Eat less than 2,300 milligrams (mg) of sodium a day. If you limit your sodium to 1,500 mg a day, you can lower your blood pressure even more.  Tips for success  ?? Start small. Do not try to make dramatic changes to your diet all at once. You might feel that you are missing out on your favorite foods and then be more likely to not follow the plan. Make small changes, and stick with them. Once those changes become habit, add a few more changes.  ?? Try some of the following:  ?? Make it a goal to eat a fruit or vegetable at every meal and at snacks. This will make it easy to get the recommended amount of fruits and vegetables each day.  ?? Try yogurt topped with fruit and nuts for a snack or healthy dessert.  ?? Add lettuce, tomato, cucumber, and onion to sandwiches.  ?? Combine a ready-made pizza crust with low-fat mozzarella cheese and lots of vegetable toppings. Try using tomatoes, squash, spinach, broccoli, carrots, cauliflower, and onions.  ?? Have a variety of cut-up vegetables with a low-fat dip as an appetizer instead of chips and dip.  ?? Sprinkle sunflower seeds or chopped almonds over salads. Or try adding chopped walnuts or almonds to cooked vegetables.   ?? Try some vegetarian meals using beans and peas. Add garbanzo or kidney beans to salads. Make burritos and tacos with mashed pinto beans or black beans.  Where can you learn more?  Go to [employer].  Enter [phone] in the search box to learn more about "DASH Diet: Care Instructions."  Current as of: July 10, 2014  Content Version: 11.2  ?? 2006-2017 [employer]. Care instructions adapted under license by [employer] (which disclaims liability or warranty for this information). If you have questions about a medical condition or this instruction,  always ask your healthcare professional. [employer] disclaims any warranty or liability for your use of this information.       Learning About High Cholesterol  What is high cholesterol?  Cholesterol is a type of fat in your blood. It is needed for many body functions, such as making new cells. Cholesterol is made by your body. It also comes from food you eat.  If you have too much cholesterol, it starts to build up in your arteries. This is called hardening of the arteries, or atherosclerosis. High cholesterol raises your risk of a heart attack and stroke.  There are different types of cholesterol. LDL is the "bad" cholesterol. High LDL can raise your risk for heart disease, heart attack, and stroke. HDL is the "good" cholesterol. High HDL is linked with a lower risk for heart disease, heart attack, and stroke.  Your cholesterol levels help your doctor find out your risk for having a heart attack or stroke.  How can you prevent high cholesterol?  A heart-healthy lifestyle can help you prevent high cholesterol. This lifestyle helps lower your risk for a heart attack and stroke.  ?? Eat heart-healthy foods.  ?? Eat fruits, vegetables, whole grains (like oatmeal), dried beans and peas, nuts and seeds, soy products (like tofu), and fat-free or low-fat dairy products.   ?? Replace butter, margarine, and hydrogenated or partially hydrogenated oils with olive and canola oils. (Canola oil margarine without trans fat is fine.)  ?? Replace red meat with fish, poultry, and soy protein (like tofu).  ?? Limit processed and packaged foods like chips, crackers, and cookies.  ?? Be active. Exercise can improve your cholesterol level. Get at least 30 minutes of exercise on most days of the week. Walking is a good choice. You also may want to do other activities, such as running, swimming, cycling, or playing tennis or team sports.  ?? Stay at a healthy weight. Lose weight if you need to.  ?? Don't smoke. If you need help quitting, talk to your doctor about stop-smoking programs and medicines. These can increase your chances of quitting for good.  How is high cholesterol treated?  The goal of treatment is to reduce your chances of having a heart attack or stroke. The goal is not to lower your cholesterol numbers only.  ?? You may make lifestyle changes, such as eating healthy foods, not smoking, losing weight, and being more active.  ?? You may have to take medicine.  Follow-up care is a key part of your treatment and safety. Be sure to make and go to all appointments, and call your doctor if you are having problems. It's also a good idea to know your test results and keep a list of the medicines you take.  Where can you learn more?  Go to [employer].  Enter [phone] in the search box to learn more about "Learning About High Cholesterol."  Current as of: May 15, 2014  Content Version: 11.2  ?? 2006-2017 [employer]. Care instructions adapted under license by [employer] (which disclaims liability or warranty for this information). If you have questions about a medical condition or this instruction, always ask your healthcare professional. [employer] disclaims any warranty or liability for your use of this information.        Follow-up Disposition:  Return in about 3 months (around 11/18/2015), or if symptoms worsen or fail to improve.

## 2015-08-19 LAB — METABOLIC PANEL, COMPREHENSIVE
A-G Ratio: 1.6 (ref 1.2–2.2)
ALT (SGPT): 31 IU/L (ref 0–44)
AST (SGOT): 25 IU/L (ref 0–40)
Albumin: 4.4 g/dL (ref 3.5–5.5)
Alk. phosphatase: 67 IU/L (ref 39–117)
BUN/Creatinine ratio: 14 (ref 9–20)
BUN: 16 mg/dL (ref 6–24)
Bilirubin, total: 0.4 mg/dL (ref 0.0–1.2)
CO2: 27 mmol/L (ref 18–29)
Calcium: 9.6 mg/dL (ref 8.7–10.2)
Chloride: 97 mmol/L (ref 96–106)
Creatinine: 1.12 mg/dL (ref 0.76–1.27)
GFR est AA: 87 mL/min/{1.73_m2} (ref >59)
GFR est non-AA: 75 mL/min/{1.73_m2} (ref >59)
GLOBULIN, TOTAL: 2.8 g/dL (ref 1.5–4.5)
Glucose: 92 mg/dL (ref 65–99)
Potassium: 4.5 mmol/L (ref 3.5–5.2)
Protein, total: 7.2 g/dL (ref 6.0–8.5)
Sodium: 140 mmol/L (ref 134–144)

## 2015-08-19 LAB — CBC W/O DIFF
HCT: 48.5 % (ref 37.5–51.0)
HGB: 15.8 g/dL (ref 12.6–17.7)
MCH: 29.9 pg (ref 26.6–33.0)
MCHC: 32.6 g/dL (ref 31.5–35.7)
MCV: 92 fL (ref 79–97)
PLATELET: 208 10*3/uL (ref 150–379)
RBC: 5.29 x10E6/uL (ref 4.14–5.80)
RDW: 13.9 % (ref 12.3–15.4)
WBC: 4.9 10*3/uL (ref 3.4–10.8)

## 2015-08-19 LAB — PSA, DIAGNOSTIC (PROSTATE SPECIFIC AG): Prostate Specific Ag: 0.6 ng/mL (ref 0.0–4.0)

## 2015-08-24 LAB — OCCULT BLOOD IMMUNOASSAY,DIAGNOSTIC: Occult blood fecal, by IA: NEGATIVE

## 2015-11-19 ENCOUNTER — Ambulatory Visit
Admit: 2015-11-19 | Discharge: 2015-11-19 | Payer: PRIVATE HEALTH INSURANCE | Attending: Internal Medicine | Primary: Internal Medicine

## 2015-11-19 DIAGNOSIS — E7800 Pure hypercholesterolemia, unspecified: Secondary | ICD-10-CM

## 2015-11-19 LAB — AMB POC LIPID PROFILE
Cholesterol (POC): 164
HDL Cholesterol (POC): 50
LDL Cholesterol (POC): 97 MG/DL
Non-HDL Goal (POC): 114
TChol/HDL Ratio (POC): 3.3
Triglycerides (POC): 86

## 2015-11-19 NOTE — ACP (Advance Care Planning) (Signed)
Advanced care planning was discussed and additional information was provided.

## 2015-11-19 NOTE — Progress Notes (Signed)
Alejandro Cohen is a 55 y.o. male and presents with Cholesterol Problem  .  Subjective:  Dyslipidemia Review:  Patient presents for evaluation of lipids.  Compliance with treatment thus far has been poor.  A repeat fasting lipid profile was done.  The patient does not use medications that may worsen dyslipidemias (corticosteroids, progestins, anabolic steroids, diuretics, beta-blockers, amiodarone, cyclosporine, olanzapine). The patient exercises some,he states he could not tolerate his statin,he states he has corrected his diet with supplements re yeast rice.      Sleep apnea Review:  There is no history of excessive daytime fatigue.He has excessive snoring at night.  There is also a history of periods of apnea at night as witnessed by a partner and family  The patient is not on CPAP.    Health maintenance suggests the needs for a hepatitis screen          Review of Systems  Constitutional: negative for fevers, chills, anorexia and weight loss  Eyes:   negative for visual disturbance and irritation  ENT:   negative for tinnitus,sore throat,nasal congestion,ear pains.hoarseness  Respiratory:  negative for cough, hemoptysis, dyspnea,wheezing  CV:   negative for chest pain, palpitations, lower extremity edema  GI:   negative for nausea, vomiting, diarrhea, abdominal pain,melena  Endo:               negative for polyuria,polydipsia,polyphagia,heat intolerance  Genitourinary: negative for frequency, dysuria and hematuria  Integument:  negative for rash and pruritus  Hematologic:  negative for easy bruising and gum/nose bleeding  Musculoskel: negative for myalgias, arthralgias, back pain, muscle weakness, joint pain  Neurological:  negative for headaches, dizziness, vertigo, memory problems and gait   Behavl/Psych: negative for feelings of anxiety, depression, mood changes    Past Medical History:   Diagnosis Date   ??? Contact dermatitis and other eczema, due to unspecified cause     acne      History reviewed. No pertinent surgical history.  Social History     Social History   ??? Marital status: MARRIED     Spouse name: N/A   ??? Number of children: N/A   ??? Years of education: N/A     Social History Main Topics   ??? Smoking status: Former Smoker     Quit date: 10/09/1987   ??? Smokeless tobacco: Never Used   ??? Alcohol use No   ??? Drug use: No   ??? Sexual activity: Yes     Partners: Female     Birth control/ protection: None     Other Topics Concern   ??? None     Social History Narrative     Family History   Problem Relation Age of Onset   ??? Diabetes Mother    ??? Elevated Lipids Mother    ??? Migraines Mother    ??? Heart Disease Mother    ??? Hypertension Mother    ??? Psychiatric Disorder Mother    ??? Stroke Mother    ??? Alcohol abuse Father    ??? Diabetes Father    ??? Elevated Lipids Father    ??? Heart Disease Father    ??? Hypertension Father    ??? Stroke Father    ??? Alcohol abuse Maternal Grandmother    ??? Alcohol abuse Maternal Grandfather    ??? Alcohol abuse Paternal Grandmother    ??? Alcohol abuse Paternal Grandfather      Current Outpatient Prescriptions   Medication Sig Dispense Refill   ??? red yeast  rice extract 600 mg cap Take 600 mg by mouth now.     ??? aspirin delayed-release 81 mg tablet Take  by mouth daily.     ??? Omega-3 Fatty Acids (FISH OIL) 300 mg cap Take  by mouth.     ??? ketoconazole (NIZORAL) 2 % topical cream APPLY TO AFFECTED AREA(S) ONCE DAILY  0   ??? multivitamin (ONE A DAY) tablet Take 1 Tab by mouth daily.       No Known Allergies    Objective:  Visit Vitals   ??? BP 110/70 (BP 1 Location: Right arm, BP Patient Position: Sitting)   ??? Pulse 91   ??? Temp 97.5 ??F (36.4 ??C)   ??? Resp 17   ??? Ht 5\' 9"  (1.753 m)   ??? Wt 232 lb (105.2 kg)   ??? SpO2 93%   ??? BMI 34.26 kg/m2     Physical Exam:   General appearance - alert, well appearing, and in no distress  Mental status - alert, oriented to person, place, and time  EYE-PERLA, EOMI, corneas normal, no foreign bodies  ENT-ENT exam normal, no neck nodes or sinus tenderness   Nose - normal and patent, no erythema, discharge or polyps  Mouth - mucous membranes moist, pharynx normal without lesions  Neck - supple, no significant adenopathy   Chest - clear to auscultation, no wheezes, rales or rhonchi, symmetric air entry   Heart - normal rate, regular rhythm, normal S1, S2, no murmurs, rubs, clicks or gallops   Abdomen - soft, nontender, nondistended, no masses or organomegaly  Lymph- no adenopathy palpable  Ext-peripheral pulses normal, no pedal edema, no clubbing or cyanosis  Skin-Warm and dry. no hyperpigmentation, vitiligo, or suspicious lesions  Neuro -alert, oriented, normal speech, no focal findings or movement disorder noted  Neck-normal C-spine, no tenderness, full ROM without pain  Feet-no nail deformities or callus formation with good pulses noted      Results for orders placed or performed in visit on 11/19/15   AMB POC LIPID PROFILE   Result Value Ref Range    Cholesterol (POC) 164     Triglycerides (POC) 86     HDL Cholesterol (POC) 50     LDL Cholesterol (POC) 97 MG/DL    Non-HDL Goal (POC) 540     TChol/HDL Ratio (POC) 3.3        Assessment/Plan:    ICD-10-CM ICD-9-CM    1. Hypercholesteremia E78.00 272.0 AMB POC LIPID PROFILE      red yeast rice extract 600 mg cap   2. Obstructive sleep apnea syndrome G47.33 327.23 REFERRAL TO SLEEP STUDIES   3. Need for hepatitis C screening test Z11.59 V73.89 HEPATITIS C AB     Orders Placed This Encounter   ??? HEPATITIS C AB   ??? REFERRAL TO SLEEP STUDIES     Referral Priority:   Routine     Referral Type:   Consultation     Referral Reason:   Specialty Services Required     Referred to Provider:   Sherrell Puller, MD     Number of Visits Requested:   1   ??? AMB POC LIPID PROFILE   ??? ketoconazole (NIZORAL) 2 % topical cream     Sig: APPLY TO AFFECTED AREA(S) ONCE DAILY     Refill:  0   ??? red yeast rice extract 600 mg cap     Sig: Take 600 mg by mouth now.     lose weight, increase  physical activity, follow low fat diet, follow low  salt diet,Take 81mg  aspirin daily  Patient Instructions   MyChart Activation    Thank you for requesting access to MyChart. Please follow the instructions below to securely access and download your online medical record. MyChart allows you to send messages to your doctor, view your test results, renew your prescriptions, schedule appointments, and more.    How Do I Sign Up?    1. In your internet browser, go to [email or web address]  2. Click on the First Time User? Click Here link in the Sign In box. You will be redirect to the New Member Sign Up page.  3. Enter your MyChart Access Code exactly as it appears below. You will not need to use this code after you???ve completed the sign-up process. If you do not sign up before the expiration date, you must request a new code.    MyChart Access Code: Activation code not generated  Current MyChart Status: Active (This is the date your MyChart access code will expire)    4. Enter the last four digits of your Social Security Number (xxxx) and Date of Birth (mm/dd/yyyy) as indicated and click Submit. You will be taken to the next sign-up page.  5. Create a MyChart ID. This will be your MyChart login ID and cannot be changed, so think of one that is secure and easy to remember.  6. Create a MyChart password. You can change your password at any time.  7. Enter your Password Reset Question and Answer. This can be used at a later time if you forget your password.   8. Enter your e-mail address. You will receive e-mail notification when new information is available in MyChart.  9. Click Sign Up. You can now view and download portions of your medical record.  10. Click the Download Summary menu link to download a portable copy of your medical information.    Additional Information    If you have questions, please visit the Frequently Asked Questions section of the MyChart website at [email or web address]  Remember, MyChart is NOT to be used for urgent needs. For medical emergencies, dial 911.           Follow-up Disposition:  Return in about 3 months (around 02/19/2016), or if symptoms worsen or fail to improve.      I have reviewed with the patient details of the assessment and plan and all questions were answered. Relevent patient education was performed.The most recent lab findings were reviewed with the patient.    An After Visit Summary was printed and given to the patient.

## 2015-11-19 NOTE — Patient Instructions (Signed)
MyChart Activation    Thank you for requesting access to MyChart. Please follow the instructions below to securely access and download your online medical record. MyChart allows you to send messages to your doctor, view your test results, renew your prescriptions, schedule appointments, and more.    How Do I Sign Up?    1. In your internet browser, go to [email or web address]  2. Click on the First Time User? Click Here link in the Sign In box. You will be redirect to the New Member Sign Up page.  3. Enter your MyChart Access Code exactly as it appears below. You will not need to use this code after you???ve completed the sign-up process. If you do not sign up before the expiration date, you must request a new code.    MyChart Access Code: Activation code not generated  Current MyChart Status: Active (This is the date your MyChart access code will expire)    4. Enter the last four digits of your Social Security Number (xxxx) and Date of Birth (mm/dd/yyyy) as indicated and click Submit. You will be taken to the next sign-up page.  5. Create a MyChart ID. This will be your MyChart login ID and cannot be changed, so think of one that is secure and easy to remember.  6. Create a MyChart password. You can change your password at any time.  7. Enter your Password Reset Question and Answer. This can be used at a later time if you forget your password.   8. Enter your e-mail address. You will receive e-mail notification when new information is available in MyChart.  9. Click Sign Up. You can now view and download portions of your medical record.  10. Click the Download Summary menu link to download a portable copy of your medical information.    Additional Information    If you have questions, please visit the Frequently Asked Questions section of the MyChart website at [email or web address] Remember, MyChart is NOT to be used for urgent needs. For medical emergencies, dial 911.

## 2015-11-20 LAB — HEPATITIS C AB: Hep C Virus Ab: <0.1 s/co ratio (ref 0.0–0.9)

## 2015-11-20 LAB — HEPATITIS C ANTIBODY: HCV Ab: <0.1 s/co ratio (ref 0.0–0.9)

## 2015-12-25 ENCOUNTER — Encounter: Attending: Pediatric Pulmonology | Primary: Internal Medicine

## 2016-02-19 ENCOUNTER — Encounter: Attending: Internal Medicine | Primary: Internal Medicine

## 2016-02-24 ENCOUNTER — Encounter: Attending: Pediatric Pulmonology | Primary: Internal Medicine

## 2016-03-08 ENCOUNTER — Ambulatory Visit: Admit: 2016-03-08 | Payer: PRIVATE HEALTH INSURANCE | Attending: Internal Medicine | Primary: Internal Medicine

## 2016-03-08 DIAGNOSIS — E7800 Pure hypercholesterolemia, unspecified: Secondary | ICD-10-CM

## 2016-03-08 LAB — AMB POC LIPID PROFILE
Cholesterol (POC): 200
HDL Cholesterol (POC): 62
LDL Cholesterol (POC): 111 MG/DL
Non-HDL Cholesterol: 139
TChol/HDL Ratio (POC): 3.2
Triglycerides (POC): 140

## 2016-03-08 NOTE — Patient Instructions (Signed)
MyChart Activation    Thank you for requesting access to MyChart. Please follow the instructions below to securely access and download your online medical record. MyChart allows you to send messages to your doctor, view your test results, renew your prescriptions, schedule appointments, and more.    How Do I Sign Up?    1. In your internet browser, go to [email or web address]  2. Click on the First Time User? Click Here link in the Sign In box. You will be redirect to the New Member Sign Up page.  3. Enter your MyChart Access Code exactly as it appears below. You will not need to use this code after you???ve completed the sign-up process. If you do not sign up before the expiration date, you must request a new code.    MyChart Access Code: Activation code not generated  Current MyChart Status: Active (This is the date your MyChart access code will expire)    4. Enter the last four digits of your Social Security Number (xxxx) and Date of Birth (mm/dd/yyyy) as indicated and click Submit. You will be taken to the next sign-up page.  5. Create a MyChart ID. This will be your MyChart login ID and cannot be changed, so think of one that is secure and easy to remember.  6. Create a MyChart password. You can change your password at any time.  7. Enter your Password Reset Question and Answer. This can be used at a later time if you forget your password.   8. Enter your e-mail address. You will receive e-mail notification when new information is available in MyChart.  9. Click Sign Up. You can now view and download portions of your medical record.  10. Click the Download Summary menu link to download a portable copy of your medical information.    Additional Information    If you have questions, please visit the Frequently Asked Questions section of the MyChart website at [email or web address] Remember, MyChart is NOT to be used for urgent needs. For medical emergencies, dial 911.

## 2016-03-08 NOTE — Progress Notes (Signed)
Signed flu imm and Tdap imm per Dr. Chilton SiGreen.

## 2016-03-08 NOTE — Addendum Note (Signed)
Addended by: Catalina GravelABNEY, Caedyn Raygoza L on: 03/08/2016 09:44 AM      Modules accepted: Orders, SmartSet

## 2016-03-08 NOTE — Progress Notes (Signed)
Alejandro Cohen is a 55 y.o. male and presents with Cholesterol Problem    Subjective:  Dyslipidemia Review:  Patient presents for evaluation of lipids.  Compliance with treatment thus far has been excellent.  A repeat fasting lipid profile was done.  The patient does not use medications that may worsen dyslipidemias (corticosteroids, progestins, anabolic steroids, diuretics, beta-blockers, amiodarone, cyclosporine, olanzapine). The patient exercises some    Health maintenance suggests the needs for an influenza injection      Review of Systems  Constitutional: negative for fevers, chills, anorexia and weight loss  Eyes:   negative for visual disturbance and irritation  ENT:   negative for tinnitus,sore throat,nasal congestion,ear pains.hoarseness  Respiratory:  negative for cough, hemoptysis, dyspnea,wheezing  CV:   negative for chest pain, palpitations, lower extremity edema  GI:   negative for nausea, vomiting, diarrhea, abdominal pain,melena  Endo:               negative for polyuria,polydipsia,polyphagia,heat intolerance  Genitourinary: negative for frequency, dysuria and hematuria  Integument:  negative for rash and pruritus  Hematologic:  negative for easy bruising and gum/nose bleeding  Musculoskel: negative for myalgias, arthralgias, back pain, muscle weakness, joint pain  Neurological:  negative for headaches, dizziness, vertigo, memory problems and gait   Behavl/Psych: negative for feelings of anxiety, depression, mood changes    Past Medical History:   Diagnosis Date   ??? Contact dermatitis and other eczema, due to unspecified cause     acne     History reviewed. No pertinent surgical history.  Social History     Social History   ??? Marital status: MARRIED     Spouse name: N/A   ??? Number of children: N/A   ??? Years of education: N/A     Social History Main Topics   ??? Smoking status: Former Smoker     Quit date: 10/09/1987   ??? Smokeless tobacco: Never Used   ??? Alcohol use No   ??? Drug use: No    ??? Sexual activity: Yes     Partners: Female     Birth control/ protection: None     Other Topics Concern   ??? None     Social History Narrative     Family History   Problem Relation Age of Onset   ??? Diabetes Mother    ??? Elevated Lipids Mother    ??? Migraines Mother    ??? Heart Disease Mother    ??? Hypertension Mother    ??? Psychiatric Disorder Mother    ??? Stroke Mother    ??? Alcohol abuse Father    ??? Diabetes Father    ??? Elevated Lipids Father    ??? Heart Disease Father    ??? Hypertension Father    ??? Stroke Father    ??? Alcohol abuse Maternal Grandmother    ??? Alcohol abuse Maternal Grandfather    ??? Alcohol abuse Paternal Grandmother    ??? Alcohol abuse Paternal Grandfather      Current Outpatient Prescriptions   Medication Sig Dispense Refill   ??? ketoconazole (NIZORAL) 2 % topical cream APPLY TO AFFECTED AREA(S) ONCE DAILY  0   ??? red yeast rice extract 600 mg cap Take 600 mg by mouth now.     ??? aspirin delayed-release 81 mg tablet Take  by mouth daily.     ??? multivitamin (ONE A DAY) tablet Take 1 Tab by mouth daily.     ??? Omega-3 Fatty Acids (FISH OIL) 300  mg cap Take  by mouth.       No Known Allergies    Objective:  Visit Vitals   ??? BP 110/62 (BP 1 Location: Left arm, BP Patient Position: Sitting)   ??? Pulse 84   ??? Temp 97.8 ??F (36.6 ??C) (Oral)   ??? Resp 16   ??? Ht 5\' 9"  (1.753 m)   ??? Wt 235 lb 1.6 oz (106.6 kg)   ??? BMI 34.72 kg/m2     Physical Exam:   General appearance - alert, well appearing, and in no distress  Mental status - alert, oriented to person, place, and time  EYE-PERLA, EOMI, corneas normal, no foreign bodies  ENT-ENT exam normal, no neck nodes or sinus tenderness  Nose - normal and patent, no erythema, discharge or polyps  Mouth - mucous membranes moist, pharynx normal without lesions  Neck - supple, no significant adenopathy   Chest - clear to auscultation, no wheezes, rales or rhonchi, symmetric air entry   Heart - normal rate, regular rhythm, normal S1, S2, no murmurs, rubs, clicks or gallops    Abdomen - soft, nontender, nondistended, no masses or organomegaly  Lymph- no adenopathy palpable  Ext-peripheral pulses normal, no pedal edema, no clubbing or cyanosis  Skin-Warm and dry. no hyperpigmentation, vitiligo, or suspicious lesions  Neuro -alert, oriented, normal speech, no focal findings or movement disorder noted  Neck-normal C-spine, no tenderness, full ROM without pain  Feet-no nail deformities or callus formation with good pulses noted      Results for orders placed or performed in visit on 11/19/15   HEPATITIS C AB   Result Value Ref Range    Hep C Virus Ab <0.1 0.0 - 0.9 s/co ratio   AMB POC LIPID PROFILE   Result Value Ref Range    Cholesterol (POC) 164     Triglycerides (POC) 86     HDL Cholesterol (POC) 50     LDL Cholesterol (POC) 97 MG/DL    Non-HDL Goal (POC) 161114     TChol/HDL Ratio (POC) 3.3        Assessment/Plan:    ICD-10-CM ICD-9-CM    1. Hypercholesteremia E78.00 272.0      No orders of the defined types were placed in this encounter.    lose weight, follow low fat diet, follow low salt diet,Take 81mg  aspirin daily  Patient Instructions   MyChart Activation    Thank you for requesting access to MyChart. Please follow the instructions below to securely access and download your online medical record. MyChart allows you to send messages to your doctor, view your test results, renew your prescriptions, schedule appointments, and more.    How Do I Sign Up?    1. In your internet browser, go to [email or web address]  2. Click on the First Time User? Click Here link in the Sign In box. You will be redirect to the New Member Sign Up page.  3. Enter your MyChart Access Code exactly as it appears below. You will not need to use this code after you???ve completed the sign-up process. If you do not sign up before the expiration date, you must request a new code.    MyChart Access Code: Activation code not generated  Current MyChart Status: Active (This is the date your MyChart access code  will expire)    4. Enter the last four digits of your Social Security Number (xxxx) and Date of Birth (mm/dd/yyyy) as indicated and click Submit. You will be taken to  the next sign-up page.  5. Create a MyChart ID. This will be your MyChart login ID and cannot be changed, so think of one that is secure and easy to remember.  6. Create a MyChart password. You can change your password at any time.  7. Enter your Password Reset Question and Answer. This can be used at a later time if you forget your password.   8. Enter your e-mail address. You will receive e-mail notification when new information is available in MyChart.  9. Click Sign Up. You can now view and download portions of your medical record.  10. Click the Download Summary menu link to download a portable copy of your medical information.    Additional Information    If you have questions, please visit the Frequently Asked Questions section of the MyChart website at [email or web address] Remember, MyChart is NOT to be used for urgent needs. For medical emergencies, dial 911.           Follow-up Disposition:  Return in about 3 months (around 06/08/2016).      I have reviewed with the patient details of the assessment and plan and all questions were answered. Relevent patient education was performed.The most recent lab findings were reviewed with the patient.    An After Visit Summary was printed and given to the patient.

## 2016-03-08 NOTE — Progress Notes (Signed)
Chief Complaint   Patient presents with   ??? Cholesterol Problem       Visit Vitals   ??? BP 110/62 (BP 1 Location: Left arm, BP Patient Position: Sitting)   ??? Temp 97.8 ??F (36.6 ??C) (Oral)   ??? Resp 16   ??? Ht 5\' 9"  (1.753 m)   ??? Wt 235 lb 1.6 oz (106.6 kg)   ??? BMI 34.72 kg/m2     1. Have you been to the ER, urgent care clinic since your last visit?  Hospitalized since your last visit?no    2. Have you seen or consulted any other health care providers outside of the [facility] since your last visit?  Include any pap smears or colon screening. No      No visits with results within 3 Month(s) from this visit.  Latest known visit with results is:    Office Visit on 11/19/2015   Component Date Value Ref Range Status   ??? Cholesterol (POC) 11/19/2015 164   Final   ??? Triglycerides (POC) 11/19/2015 86   Final   ??? HDL Cholesterol (POC) 11/19/2015 50   Final   ??? LDL Cholesterol (POC) 11/19/2015 97  MG/DL Final   ??? Non-HDL Goal (POC) 11/19/2015 161114   Final   ??? TChol/HDL Ratio (POC) 11/19/2015 3.3   Final   ??? Hep C Virus Ab 11/19/2015 <0.1  0.0 - 0.9 s/co ratio Final         Legend: Use this to help understand your labs. You may not have all of these ordered   ?? WBC- Lio blood cell count  ?? HGB- Hemoglobin, red blood cell count  ?? HCT- Hematocrit, red blood cell count  ?? PLT- Platelets   ?? Sodium, Potassium, Chloride, Magnesium- electrolytes   ?? Creatinine, GFR- kidney function   ?? AST, ALT, Alkaline phosphatase, bilirubin- liver and gallbladder  ?? Lipase- pancreas   ?? RPR- Syphilis test, STD  ?? HIV, Gonorrhea, Chlamydia, Trichomonas- STDs  ?? Candida- yeast infection  ?? Nuswab- vaginal infections   ?? Urinalysis- a quick test about your urine   ?? Hemoglobin A1C- diabetes test  ?? Glucose- blood sugar   ?? HDL- "Good" Cholesterol. Goal >=40  ?? LDL- "Bad" Cholesterol. Goal <100  ?? Triglycerides- Goal <150   ?? Vit D- Vitamin D level   ?? TSH, FT3, FT4- thyroid tests   ?? HCV Ab- test for Hepatitis C   ??

## 2016-06-08 ENCOUNTER — Encounter: Attending: Internal Medicine | Primary: Internal Medicine

## 2016-06-15 ENCOUNTER — Ambulatory Visit
Admit: 2016-06-15 | Discharge: 2016-06-15 | Payer: PRIVATE HEALTH INSURANCE | Attending: Internal Medicine | Primary: Internal Medicine

## 2016-06-15 DIAGNOSIS — M722 Plantar fascial fibromatosis: Secondary | ICD-10-CM

## 2016-06-15 MED ORDER — CEPHALEXIN 500 MG CAP
500 mg | ORAL_CAPSULE | Freq: Four times a day (QID) | ORAL | 3 refills | Status: DC
Start: 2016-06-15 — End: 2016-07-09

## 2016-06-15 NOTE — ACP (Advance Care Planning) (Signed)
Advanced care planning was discussed and additional information was provided.

## 2016-06-15 NOTE — Patient Instructions (Signed)
MyChart Activation    Thank you for requesting access to MyChart. Please follow the instructions below to securely access and download your online medical record. MyChart allows you to send messages to your doctor, view your test results, renew your prescriptions, schedule appointments, and more.    How Do I Sign Up?    1. In your internet browser, go to [email or web address]  2. Click on the First Time User? Click Here link in the Sign In box. You will be redirect to the New Member Sign Up page.  3. Enter your MyChart Access Code exactly as it appears below. You will not need to use this code after you???ve completed the sign-up process. If you do not sign up before the expiration date, you must request a new code.    MyChart Access Code: Activation code not generated  Current MyChart Status: Active (This is the date your MyChart access code will expire)    4. Enter the last four digits of your Social Security Number (xxxx) and Date of Birth (mm/dd/yyyy) as indicated and click Submit. You will be taken to the next sign-up page.  5. Create a MyChart ID. This will be your MyChart login ID and cannot be changed, so think of one that is secure and easy to remember.  6. Create a MyChart password. You can change your password at any time.  7. Enter your Password Reset Question and Answer. This can be used at a later time if you forget your password.   8. Enter your e-mail address. You will receive e-mail notification when new information is available in MyChart.  9. Click Sign Up. You can now view and download portions of your medical record.  10. Click the Download Summary menu link to download a portable copy of your medical information.    Additional Information    If you have questions, please visit the Frequently Asked Questions section of the MyChart website at [email or web address] Remember, MyChart is NOT to be used for urgent needs. For medical emergencies, dial 911.

## 2016-06-15 NOTE — Progress Notes (Signed)
1. Have you been to the ER, urgent care clinic since your last visit?  Hospitalized since your last visit?no    2. Have you seen or consulted any other health care providers outside of the [facility] since your last visit?  Include any pap smears or colon screening. no

## 2016-06-15 NOTE — Progress Notes (Signed)
Alejandro Cohen is a 56 y.o. male and presents with Ear Pain and Foot Pain    Subjective:  Plantar fascia review:  Pains in the lt. foot  are report to be worse  upon standing that is increased improved by certain shoes    Ear Pain Review:  Alejandro Cohen is a 56 y.o. male who presents for possible ear infection. Symptoms include left ear pain. Onset of symptoms was 2 days ago, gradually worsening since that time. Associated symptoms include achiness, which have been present for 2 days . He is drinking plenty of fluids.            Review of Systems  Constitutional: negative for fevers, chills, anorexia and weight loss  Eyes:   negative for visual disturbance and irritation  ENT:   negative for tinnitus,sore throat,nasal congestion,ear pains.hoarseness  Respiratory:  negative for cough, hemoptysis, dyspnea,wheezing  CV:   negative for chest pain, palpitations, lower extremity edema  GI:   negative for nausea, vomiting, diarrhea, abdominal pain,melena  Endo:               negative for polyuria,polydipsia,polyphagia,heat intolerance  Genitourinary: negative for frequency, dysuria and hematuria  Integument:  negative for rash and pruritus  Hematologic:  negative for easy bruising and gum/nose bleeding  Musculoskel: negative for myalgias, arthralgias, back pain, muscle weakness, joint pain  Neurological:  negative for headaches, dizziness, vertigo, memory problems and gait   Behavl/Psych: negative for feelings of anxiety, depression, mood changes    Past Medical History:   Diagnosis Date   ??? Contact dermatitis and other eczema, due to unspecified cause     acne     History reviewed. No pertinent surgical history.  Social History     Social History   ??? Marital status: MARRIED     Spouse name: N/A   ??? Number of children: N/A   ??? Years of education: N/A     Social History Main Topics   ??? Smoking status: Former Smoker     Quit date: 10/09/1987   ??? Smokeless tobacco: Never Used   ??? Alcohol use No   ??? Drug use: No    ??? Sexual activity: Yes     Partners: Female     Birth control/ protection: None     Other Topics Concern   ??? None     Social History Narrative     Family History   Problem Relation Age of Onset   ??? Diabetes Mother    ??? Elevated Lipids Mother    ??? Migraines Mother    ??? Heart Disease Mother    ??? Hypertension Mother    ??? Psychiatric Disorder Mother    ??? Stroke Mother    ??? Alcohol abuse Father    ??? Diabetes Father    ??? Elevated Lipids Father    ??? Heart Disease Father    ??? Hypertension Father    ??? Stroke Father    ??? Alcohol abuse Maternal Grandmother    ??? Alcohol abuse Maternal Grandfather    ??? Alcohol abuse Paternal Grandmother    ??? Alcohol abuse Paternal Grandfather      Current Outpatient Prescriptions   Medication Sig Dispense Refill   ??? cephALEXin (KEFLEX) 500 mg capsule Take 1 Cap by mouth four (4) times daily. 28 Cap 3   ??? aspirin delayed-release 81 mg tablet Take  by mouth daily.     ??? Omega-3 Fatty Acids (FISH OIL) 300 mg cap Take  by mouth.     ??? ketoconazole (NIZORAL) 2 % topical cream APPLY TO AFFECTED AREA(S) ONCE DAILY  0   ??? red yeast rice extract 600 mg cap Take 600 mg by mouth now.     ??? multivitamin (ONE A DAY) tablet Take 1 Tab by mouth daily.       No Known Allergies    Objective:  Visit Vitals   ??? BP 120/70 (BP 1 Location: Right arm, BP Patient Position: Sitting)   ??? Pulse 70   ??? Temp 98 ??F (36.7 ??C) (Oral)   ??? Resp 20   ??? Ht 5\' 9"  (1.753 m)   ??? Wt 240 lb (108.9 kg)   ??? SpO2 100%   ??? BMI 35.44 kg/m2     Physical Exam:   General appearance - alert, well appearing, and in no distress  Mental status - alert, oriented to person, place, and time  EYE-PERLA, EOMI, corneas normal, no foreign bodies  ENT-ENT exam normal, no neck nodes or sinus tenderness  Nose - normal and patent, no erythema, discharge or polyps  Mouth - mucous membranes moist, pharynx normal without lesions  Neck - supple, no significant adenopathy   Chest - clear to auscultation, no wheezes, rales or rhonchi, symmetric air entry    Heart - normal rate, regular rhythm, normal S1, S2, no murmurs, rubs, clicks or gallops   Abdomen - soft, nontender, nondistended, no masses or organomegaly  Lymph- no adenopathy palpable  Ext-peripheral pulses normal, no pedal edema, no clubbing or cyanosis  Skin-Warm and dry. no hyperpigmentation, vitiligo, or suspicious lesions  Neuro -alert, oriented, normal speech, no focal findings or movement disorder noted  Neck-normal C-spine, no tenderness, full ROM without pain  Feet-no nail deformities or callus formation with good pulses noted      Results for orders placed or performed in visit on 03/08/16   AMB POC LIPID PROFILE   Result Value Ref Range    Cholesterol (POC) 200     Triglycerides (POC) 140     HDL Cholesterol (POC) 62     Non-HDL Cholesterol 139     LDL Cholesterol (POC) 111 MG/DL    TChol/HDL Ratio (POC) 3.2        Assessment/Plan:    ICD-10-CM ICD-9-CM    1. Plantar fasciitis of left foot M72.2 728.71 REFERRAL TO PODIATRY   2. Abscess L02.91 682.9      Orders Placed This Encounter   ??? REFERRAL TO PODIATRY     Referral Priority:   Routine     Referral Type:   Consultation     Referral Reason:   Specialty Services Required     Referral Location:   [facility]     Referred to Provider:   Natasha Mead, DPM     Number of Visits Requested:   1   ??? cephALEXin (KEFLEX) 500 mg capsule     Sig: Take 1 Cap by mouth four (4) times daily.     Dispense:  28 Cap     Refill:  3     lose weight, follow low fat diet, follow low salt diet,Take 81mg  aspirin daily  Patient Instructions   MyChart Activation    Thank you for requesting access to MyChart. Please follow the instructions below to securely access and download your online medical record. MyChart allows you to send messages to your doctor, view your test results, renew your prescriptions, schedule appointments, and more.  How Do I Sign Up?    1. In your internet browser, go to [email or web address]   2. Click on the First Time User? Click Here link in the Sign In box. You will be redirect to the New Member Sign Up page.  3. Enter your MyChart Access Code exactly as it appears below. You will not need to use this code after you???ve completed the sign-up process. If you do not sign up before the expiration date, you must request a new code.    MyChart Access Code: Activation code not generated  Current MyChart Status: Active (This is the date your MyChart access code will expire)    4. Enter the last four digits of your Social Security Number (xxxx) and Date of Birth (mm/dd/yyyy) as indicated and click Submit. You will be taken to the next sign-up page.  5. Create a MyChart ID. This will be your MyChart login ID and cannot be changed, so think of one that is secure and easy to remember.  6. Create a MyChart password. You can change your password at any time.  7. Enter your Password Reset Question and Answer. This can be used at a later time if you forget your password.   8. Enter your e-mail address. You will receive e-mail notification when new information is available in MyChart.  9. Click Sign Up. You can now view and download portions of your medical record.  10. Click the Download Summary menu link to download a portable copy of your medical information.    Additional Information    If you have questions, please visit the Frequently Asked Questions section of the MyChart website at [email or web address] Remember, MyChart is NOT to be used for urgent needs. For medical emergencies, dial 911.           Follow-up Disposition:  Return in about 3 months (around 09/12/2016).      I have reviewed with the patient details of the assessment and plan and all questions were answered. Relevent patient education was performed.The most recent lab findings were reviewed with the patient.    An After Visit Summary was printed and given to the patient.

## 2016-07-09 ENCOUNTER — Ambulatory Visit
Admit: 2016-07-09 | Discharge: 2016-07-09 | Payer: PRIVATE HEALTH INSURANCE | Attending: Internal Medicine | Primary: Internal Medicine

## 2016-07-09 DIAGNOSIS — J069 Acute upper respiratory infection, unspecified: Secondary | ICD-10-CM

## 2016-07-09 MED ORDER — AZITHROMYCIN 250 MG TAB
250 mg | ORAL_TABLET | ORAL | 0 refills | Status: DC
Start: 2016-07-09 — End: 2017-05-09

## 2016-07-09 NOTE — Progress Notes (Signed)
Alejandro Cohen is a 56 y.o. male and presents with Flu (f/u)  .  Subjective:  Upper respiratory infection Review:  Alejandro Cohen is a 56 y.o. male who complains of nasal congestion,, dry cough,  for the past few days, gradually improved since that time.he was seen at [facility] treated and released.  He denies a history of shortness of breath.  Evaluation to date: none.   Treatment to date: decongestants, antihistamines, cough suppressants, OTC products.  Patient does not smoke cigarettes.  Relevant PMH: No pertinent additional PMH.          Review of Systems  Constitutional: negative for fevers, chills, anorexia and weight loss  Eyes:   negative for visual disturbance and irritation  ENT:   negative for tinnitus,sore throat,nasal congestion,ear pains.hoarseness  Respiratory:  cough,   CV:   negative for chest pain, palpitations, lower extremity edema  GI:   negative for nausea, vomiting, diarrhea, abdominal pain,melena  Endo:               negative for polyuria,polydipsia,polyphagia,heat intolerance  Genitourinary: negative for frequency, dysuria and hematuria  Integument:  negative for rash and pruritus  Hematologic:  negative for easy bruising and gum/nose bleeding  Musculoskel: negative for myalgias, arthralgias, back pain, muscle weakness, joint pain  Neurological:  negative for headaches, dizziness, vertigo, memory problems and gait   Behavl/Psych: negative for feelings of anxiety, depression, mood changes    Past Medical History:   Diagnosis Date   ??? Contact dermatitis and other eczema, due to unspecified cause     acne     History reviewed. No pertinent surgical history.  Social History     Social History   ??? Marital status: MARRIED     Spouse name: N/A   ??? Number of children: N/A   ??? Years of education: N/A     Social History Main Topics   ??? Smoking status: Former Smoker     Quit date: 10/09/1987   ??? Smokeless tobacco: Never Used   ??? Alcohol use No   ??? Drug use: No   ??? Sexual activity: Yes      Partners: Female     Birth control/ protection: None     Other Topics Concern   ??? None     Social History Narrative     Family History   Problem Relation Age of Onset   ??? Diabetes Mother    ??? Elevated Lipids Mother    ??? Migraines Mother    ??? Heart Disease Mother    ??? Hypertension Mother    ??? Psychiatric Disorder Mother    ??? Stroke Mother    ??? Alcohol abuse Father    ??? Diabetes Father    ??? Elevated Lipids Father    ??? Heart Disease Father    ??? Hypertension Father    ??? Stroke Father    ??? Alcohol abuse Maternal Grandmother    ??? Alcohol abuse Maternal Grandfather    ??? Alcohol abuse Paternal Grandmother    ??? Alcohol abuse Paternal Grandfather      Current Outpatient Prescriptions   Medication Sig Dispense Refill   ??? ketoconazole (NIZORAL) 2 % topical cream APPLY TO AFFECTED AREA(S) ONCE DAILY  0   ??? red yeast rice extract 600 mg cap Take 600 mg by mouth now.     ??? aspirin delayed-release 81 mg tablet Take  by mouth daily.     ??? multivitamin (ONE A DAY) tablet Take  1 Tab by mouth daily.     ??? Omega-3 Fatty Acids (FISH OIL) 300 mg cap Take  by mouth.       No Known Allergies    Objective:  Visit Vitals   ??? BP 110/76   ??? Pulse 75   ??? Temp 98.5 ??F (36.9 ??C) (Oral)   ??? Resp 16   ??? Ht 5\' 9"  (1.753 m)   ??? Wt 233 lb (105.7 kg)   ??? SpO2 98%   ??? BMI 34.41 kg/m2     Physical Exam:   General appearance - alert, well appearing, and in no distress  Mental status - alert, oriented to person, place, and time  EYE-PERLA, EOMI, corneas normal, no foreign bodies  ENT-ENT exam normal, no neck nodes or sinus tenderness  Nose - normal and patent, no erythema, discharge or polyps  Mouth - mucous membranes moist, pharynx normal without lesions  Neck - supple, no significant adenopathy   Chest - clear to auscultation, no wheezes, rales or rhonchi, symmetric air entry   Heart - normal rate, regular rhythm, normal S1, S2, no murmurs, rubs, clicks or gallops   Abdomen - soft, nontender, nondistended, no masses or organomegaly   Lymph- no adenopathy palpable  Ext-peripheral pulses normal, no pedal edema, no clubbing or cyanosis  Skin-Warm and dry. no hyperpigmentation, vitiligo, or suspicious lesions  Neuro -alert, oriented, normal speech, no focal findings or movement disorder noted  Neck-normal C-spine, no tenderness, full ROM without pain  Feet-no nail deformities or callus formation with good pulses noted      Results for orders placed or performed in visit on 03/08/16   AMB POC LIPID PROFILE   Result Value Ref Range    Cholesterol (POC) 200     Triglycerides (POC) 140     HDL Cholesterol (POC) 62     Non-HDL Cholesterol 139     LDL Cholesterol (POC) 111 MG/DL    TChol/HDL Ratio (POC) 3.2        Assessment/Plan:    ICD-10-CM ICD-9-CM    1. Viral upper respiratory tract infection J06.9 465.9     B97.89       No orders of the defined types were placed in this encounter.    lose weight, follow low fat diet, follow low salt diet,Take 81mg  aspirin daily  Patient Instructions   MyChart Activation    Thank you for requesting access to MyChart. Please follow the instructions below to securely access and download your online medical record. MyChart allows you to send messages to your doctor, view your test results, renew your prescriptions, schedule appointments, and more.    How Do I Sign Up?    1. In your internet browser, go to [email or web address]  2. Click on the First Time User? Click Here link in the Sign In box. You will be redirect to the New Member Sign Up page.  3. Enter your MyChart Access Code exactly as it appears below. You will not need to use this code after you???ve completed the sign-up process. If you do not sign up before the expiration date, you must request a new code.    MyChart Access Code: Activation code not generated  Current MyChart Status: Active (This is the date your MyChart access code will expire)    4. Enter the last four digits of your Social Security Number (xxxx) and  Date of Birth (mm/dd/yyyy) as indicated and click Submit. You will be taken to the next sign-up page.  5. Create a MyChart ID. This will be your MyChart login ID and cannot be changed, so think of one that is secure and easy to remember.  6. Create a MyChart password. You can change your password at any time.  7. Enter your Password Reset Question and Answer. This can be used at a later time if you forget your password.   8. Enter your e-mail address. You will receive e-mail notification when new information is available in MyChart.  9. Click Sign Up. You can now view and download portions of your medical record.  10. Click the Download Summary menu link to download a portable copy of your medical information.    Additional Information    If you have questions, please visit the Frequently Asked Questions section of the MyChart website at [email or web address] Remember, MyChart is NOT to be used for urgent needs. For medical emergencies, dial 911.           Follow-up Disposition:  Return in about 3 months (around 10/09/2016), or if symptoms worsen or fail to improve.      I have reviewed with the patient details of the assessment and plan and all questions were answered. Relevent patient education was performed.The most recent lab findings were reviewed with the patient.    An After Visit Summary was printed and given to the patient.

## 2016-07-09 NOTE — Patient Instructions (Signed)
MyChart Activation    Thank you for requesting access to MyChart. Please follow the instructions below to securely access and download your online medical record. MyChart allows you to send messages to your doctor, view your test results, renew your prescriptions, schedule appointments, and more.    How Do I Sign Up?    1. In your internet browser, go to [email or web address]  2. Click on the First Time User? Click Here link in the Sign In box. You will be redirect to the New Member Sign Up page.  3. Enter your MyChart Access Code exactly as it appears below. You will not need to use this code after you???ve completed the sign-up process. If you do not sign up before the expiration date, you must request a new code.    MyChart Access Code: Activation code not generated  Current MyChart Status: Active (This is the date your MyChart access code will expire)    4. Enter the last four digits of your Social Security Number (xxxx) and Date of Birth (mm/dd/yyyy) as indicated and click Submit. You will be taken to the next sign-up page.  5. Create a MyChart ID. This will be your MyChart login ID and cannot be changed, so think of one that is secure and easy to remember.  6. Create a MyChart password. You can change your password at any time.  7. Enter your Password Reset Question and Answer. This can be used at a later time if you forget your password.   8. Enter your e-mail address. You will receive e-mail notification when new information is available in MyChart.  9. Click Sign Up. You can now view and download portions of your medical record.  10. Click the Download Summary menu link to download a portable copy of your medical information.    Additional Information    If you have questions, please visit the Frequently Asked Questions section of the MyChart website at [email or web address] Remember, MyChart is NOT to be used for urgent needs. For medical emergencies, dial 911.

## 2016-07-09 NOTE — Progress Notes (Signed)
1. Have you been to the ER, urgent care clinic since your last visit?  Hospitalized since your last visit?YES;[facility]    2. Have you seen or consulted any other health care providers outside of the [facility] since your last visit?  Include any pap smears or colon screening.

## 2016-10-07 ENCOUNTER — Encounter: Attending: Internal Medicine | Primary: Internal Medicine

## 2016-12-29 ENCOUNTER — Ambulatory Visit
Admit: 2016-12-29 | Discharge: 2016-12-29 | Payer: PRIVATE HEALTH INSURANCE | Attending: Specialist | Primary: Internal Medicine

## 2016-12-29 DIAGNOSIS — G4733 Obstructive sleep apnea (adult) (pediatric): Secondary | ICD-10-CM

## 2016-12-29 NOTE — Progress Notes (Signed)
[address]  [place], [place] [id number]  Tel.  [phone]  Fax. [phone] [address]  [place], [place] [id number]  Tel.  [phone]  Fax. [phone] [address]  [place], [place] [id number]  Tel.  [phone]  Fax. [phone]       S>Markevius E Cliffton AstersWhite is a 56 y.o. male seen today to receive a home sleep testing unit (HST).    ?? Patient was educated on proper hookup and operation of the HST.  ?? Instruction forms and documentation were reviewed and signed.  ?? The patient demonstrated good understanding of the HST.    O>    Visit Vitals   ??? BP 127/80   ??? Pulse 77   ??? Ht 5\' 9"  (1.753 m)   ??? Wt 238 lb (108 kg)   ??? SpO2 96%   ??? BMI 35.15 kg/m2    Neck circ. in "inches": 16.5    A>  No diagnosis found.      P>  ?? General information regarding operations and maintenance of the device was provided.  ?? He was provided information on sleep apnea including coresponding risk factors and the importance of proper treatment.   ?? Follow-up appointment was made to return the HST. He will be contacted once the results have been reviewed.  ?? He was asked to contact our office for any problems regarding his home sleep test study.

## 2016-12-29 NOTE — Progress Notes (Signed)
[address]  [place], [place] [address]  Tel.  [phone]  Fax. [phone] [address]  [place], [place] [id number]  Tel.  [phone]  Fax. [phone] [address]  [place], [place] [id number]  Tel.  [phone]  Fax. [phone]       Chief Complaint       Chief Complaint   Patient presents with   ??? Sleep Problem     NP; ref Dr Franne FortsHarold Green; snore       HPI      Alejandro Cohen is 56 y.o. year old male referred by Dr. Franne FortsHarold Green for evaluation of a sleep disorder  .     The patient reports he has experienced daytime sleepiness, snoring, nocturnal awakening, witnessed apnea.  He has a many year history of snoring described as loud.  Snoring is heard in separate rooms through closed doors.  He has been told of associated apnea.  Snoring may be more pronounced when supine.       The patient retires at 9 pm and awakens at 4 am. The patient notes that at times he will experience frequent awakening from sleep. In general he is able to return to sleep after awakening. he tends to awaken spontaneously.    He may experience some fatigue during the day, primarily after lunch.  He has not fallen asleep in conversation, when driving or as a passenger, in meetings.  He may fall asleep watching TV.    The patient has not undergone diagnostic testing for the current problems.         Epworth Sleepiness Score: 11       No Known Allergies    Current Outpatient Prescriptions   Medication Sig Dispense Refill   ??? azithromycin (ZITHROMAX) 250 mg tablet 2 tabs today and then 1 tab daily for 4 days 6 Tab 0   ??? ketoconazole (NIZORAL) 2 % topical cream APPLY TO AFFECTED AREA(S) ONCE DAILY  0   ??? red yeast rice extract 600 mg cap Take 600 mg by mouth now.     ??? aspirin delayed-release 81 mg tablet Take  by mouth daily.     ??? multivitamin (ONE A DAY) tablet Take 1 Tab by mouth daily.     ??? Omega-3 Fatty Acids (FISH OIL) 300 mg cap Take  by mouth.           He  has a past medical history of Contact dermatitis and other eczema, due to unspecified cause. He also has no past medical history of Abuse; Anemia NEC; Arrhythmia; Arthritis; Asthma; Autoimmune disease (HCC); CAD (coronary artery disease); Calculus of kidney; Cancer (HCC); Chronic kidney disease; Chronic pain; Congestive heart failure, unspecified; COPD; Depression; Diabetes (HCC); GERD (gastroesophageal reflux disease); Headache(784.0); Hypercholesterolemia; Hypertension; Liver disease; Psychotic disorder; PUD (peptic ulcer disease); Seizures (HCC); Stroke [facility](HCC); Thromboembolus (HCC); Thyroid disease; or Trauma.    He  has no past surgical history on file.    He family history includes Alcohol abuse in his father, maternal grandfather, maternal grandmother, paternal grandfather, and paternal grandmother; Diabetes in his father and mother; Elevated Lipids in his father and mother; Heart Disease in his father and mother; Hypertension in his father and mother; Migraines in his mother; Psychiatric Disorder in his mother; Stroke in his father and mother.    He  reports that he quit smoking about 29 years ago. He has never used smokeless tobacco. He reports that he does not drink alcohol or use  illicit drugs.     Review of Systems:  Review of Systems   Constitutional: Negative for chills and fever.   HENT: Negative for hearing loss and tinnitus.    Eyes: Negative for blurred vision and double vision.   Respiratory: Negative for cough and shortness of breath.    Cardiovascular: Negative for chest pain and palpitations.   Gastrointestinal: Negative for abdominal pain and heartburn.   Genitourinary: Positive for frequency. Negative for urgency.   Musculoskeletal: Negative for back pain, joint pain and neck pain.   Skin: Negative for itching and rash.   Neurological: Negative for headaches.   Endo/Heme/Allergies: Does not bruise/bleed easily.   Psychiatric/Behavioral: Negative for depression.         Objective:      Visit Vitals   ??? BP 127/80   ??? Pulse 77   ??? Ht  (1.753 m)   ??? Wt 238 lb (108 kg)   ??? SpO2 96%   ??? BMI 35.15 kg/m2     Body mass index is 35.15 kg/(m^2).    General:   Conversant, cooperative   Eyes:  Pupils equal and reactive, no nystagmus, fundi clear, flat disk margins. Normal A/V ratio   Oropharynx:   Mallampati score IV, tongue normal   Tonsils:      Neck:   No carotid bruits; Neck circ. in "inches": 16.5   Chest/Lungs:  Clear on auscultation    CVS:  Normal rate, regular rhythm, 1+ dorsalis pedis, no distal edema   Skin:  Warm to touch; no obvious rashes   Neuro:  Speech fluent, face symmetrical, tongue movement normal   Psych:  Normal affect,  normal countenance        Assessment:       ICD-10-CM ICD-9-CM    1. OSA (obstructive sleep apnea) G47.33 327.23 SLEEP STUDY UNATTENDED, 4 CHANNEL     History of snoring, witnessed apnea consistent with sleep disordered breathing.  He will be evaluated with a home sleep test.  Results will be reviewed with him.    Plan:     Orders Placed This Encounter   ??? SLEEP STUDY UNATTENDED, 4 CHANNEL     Order Specific Question:   Reason for Exam     Answer:   Snoring, witnessed apnea       * Patient has a history and examination consistent with the diagnosis of sleep apnea.  * Sleep testing was ordered for initial evaluation.    * He was provided information on sleep apnea including corresponding risk factors and the importance of proper treatment.   * Treatment options if indicated were reviewed today.      Instructions:  o The patient would benefit from weight reduction measures.  o Do not engage in activities requiring a normal degree of alertness if fatigue is present.  o The patient understands that untreated or undertreated sleep apnea could impair judgement and the ability to function normally during the day.  o Call or return if symptoms worsen or persist.          [name], MD, FAASM  Electronically signed 12/29/16        This note was created using voice recognition software. Despite editing, there may be syntax errors.  This note will not be viewable in MyChart.

## 2016-12-29 NOTE — Patient Instructions (Addendum)
Sleep Apnea: Care Instructions  Your Care Instructions    Sleep apnea means that you frequently stop breathing for 10 seconds or longer during sleep. It can be mild to severe, based on the number of times an hour that you stop breathing or have slowed breathing.  Blocked or narrowed airways in your nose, mouth, or throat can cause sleep apnea. Your airway can become blocked when your throat muscles and tongue relax during sleep.  You can treat sleep apnea at home by making lifestyle changes. You also can use a CPAP breathing machine that keeps tissues in the throat from blocking your airway. Or your doctor may suggest that you use a breathing device while you sleep. It helps keep your airway open. This could be a device that you put in your mouth. Other examples include strips or disks that you use on your nose. In some cases, surgery may be needed to remove enlarged tissues in the throat.  Follow-up care is a key part of your treatment and safety. Be sure to make and go to all appointments, and call your doctor if you are having problems. It's also a good idea to know your test results and keep a list of the medicines you take.  How can you care for yourself at home?  ?? Lose weight, if needed. It may reduce the number of times you stop breathing or have slowed breathing.  ?? Sleep on your side. It may stop mild apnea. If you tend to roll onto your back, sew a pocket in the back of your pajama top. Put a tennis ball into the pocket, and stitch the pocket shut. This will help keep you from sleeping on your back.  ?? Avoid alcohol and medicines such as sleeping pills and sedatives before bed.  ?? Do not smoke. Smoking can make sleep apnea worse. If you need help quitting, talk to your doctor about stop-smoking programs and medicines. These can increase your chances of quitting for good.  ?? Prop up the head of your bed 4 to 6 inches by putting bricks under the legs of the bed.   ?? Treat breathing problems, such as a stuffy nose, caused by a cold or allergies.  ?? Try a continuous positive airway pressure (CPAP) breathing machine if your doctor recommends it. The machine keeps your airway open when you sleep.  ?? If CPAP does not work for you, ask your doctor if you can try other breathing machines. A bilevel positive airway pressure machine uses one type of air pressure for breathing in and another type for breathing out. Another device raises or lowers air pressure as needed while you breathe.  ?? Talk to your doctor if:  ?? Your nose feels dry or bleeds when you use one of these machines. You may need to increase moisture in the air. A humidifier may help.  ?? Your nose is runny or stuffy from using a breathing machine. Decongestants or a corticosteroid nasal spray may help.  ?? You are sleepy during the day and it gets in the way of the normal things you do. Do not drive when you are drowsy.  When should you call for help?  Watch closely for changes in your health, and be sure to contact your doctor if:  ?? ?? You still have sleep apnea even though you have made lifestyle changes.   ?? ?? You are thinking of trying a device such as CPAP.   ?? ?? You are having problems   using a CPAP or similar machine.   Where can you learn more?  Go to http://www.healthwise.net/GoodHelpConnections.  Enter J936 in the search box to learn more about "Sleep Apnea: Care Instructions."  Current as of: March 24, 2016  Content Version: 11.7  ?? 2006-2018 [employer]. Care instructions adapted under license by [employer] (which disclaims liability or warranty for this information). If you have questions about a medical condition or this instruction, always ask your healthcare professional. [employer], Inc[employer] disclaims any warranty or liability for your use of this information.

## 2016-12-30 NOTE — Progress Notes (Signed)
Patient returned Home Sleep Test.

## 2017-01-03 ENCOUNTER — Telehealth

## 2017-01-03 NOTE — Telephone Encounter (Signed)
HSAT Returned    Date of Study: 12/29/2016    The following information was gathered from the patients study log:    ?? Lights off: 8:15 PM  ?? Estimated sleep onset: 8:30 PM    ?? Awakened a total of 5-6 times  ?? The patient felt they slept 6 hours  ?? Patient took nothing before starting the test  ?? Sleep quality was worse compared to a usual nights sleep.    Further information provided: I had trouble sleeping with the device on.

## 2017-01-04 NOTE — Progress Notes (Signed)
This note is being routed to Dr. Harold Green.  Sleep Medicine consult note and sleep study report in patient's chart for review.    Thank you for the referral.

## 2017-01-04 NOTE — Telephone Encounter (Signed)
HSAT demonstrated severe sleep disordered breathing characterized by an overall AHI of 30.6/h associated with minimal SaO2 of 73%.  Events were more prominent supine with the supine???related AHI of 73.2/h.  Snoring noted during 53.3% of the recording.    APAP 8???15 cm will be initiated.    Chief technologist: Please review the HSAT results with the patient.  APAP prescription has been generated.  Please schedule first compliance appointment.

## 2017-01-11 NOTE — Telephone Encounter (Signed)
Reviewed sleep study results with patient. He expressed understanding and is willing to proceed with a trial of APAP.

## 2017-01-11 NOTE — Telephone Encounter (Signed)
Reviewed sleep study results with patient. He expressed understanding and is willing to proceed with a trial of APAP

## 2017-01-12 NOTE — Progress Notes (Signed)
Faxed CPAP order to [facility], left voice message to notify patient of dme contact info and to schedule 1st adherence

## 2017-01-13 NOTE — Progress Notes (Signed)
E-mailed appointment with dme contact information to email address on fil;e per patient request

## 2017-04-04 ENCOUNTER — Encounter: Attending: Specialist | Primary: Internal Medicine

## 2017-04-07 ENCOUNTER — Encounter: Attending: Specialist | Primary: Internal Medicine

## 2017-04-07 ENCOUNTER — Ambulatory Visit: Admit: 2017-04-07 | Payer: PRIVATE HEALTH INSURANCE | Attending: Specialist | Primary: Internal Medicine

## 2017-04-07 DIAGNOSIS — G4733 Obstructive sleep apnea (adult) (pediatric): Secondary | ICD-10-CM

## 2017-04-07 NOTE — Progress Notes (Signed)
Patient was not seen on this date.  Please refer to notes on encounter dated 04/20/2017.

## 2017-04-07 NOTE — Patient Instructions (Addendum)
Sleep Apnea: Care Instructions  Your Care Instructions    Sleep apnea means that you frequently stop breathing for 10 seconds or longer during sleep. It can be mild to severe, based on the number of times an hour that you stop breathing or have slowed breathing.  Blocked or narrowed airways in your nose, mouth, or throat can cause sleep apnea. Your airway can become blocked when your throat muscles and tongue relax during sleep.  You can treat sleep apnea at home by making lifestyle changes. You also can use a CPAP breathing machine that keeps tissues in the throat from blocking your airway. Or your doctor may suggest that you use a breathing device while you sleep. It helps keep your airway open. This could be a device that you put in your mouth. In some cases, surgery may be needed to remove enlarged tissues in the throat.  Follow-up care is a key part of your treatment and safety. Be sure to make and go to all appointments, and call your doctor if you are having problems. It's also a good idea to know your test results and keep a list of the medicines you take.  How can you care for yourself at home?  ?? Lose weight, if needed. It may reduce the number of times you stop breathing or have slowed breathing.  ?? Sleep on your side. It may stop mild apnea. If you tend to roll onto your back, sew a pocket in the back of your pajama top. Put a tennis ball into the pocket, and stitch the pocket shut. This will help keep you from sleeping on your back.  ?? Avoid alcohol and medicines such as sleeping pills and sedatives before bed.  ?? Do not smoke. Smoking can make sleep apnea worse. If you need help quitting, talk to your doctor about stop-smoking programs and medicines. These can increase your chances of quitting for good.  ?? Prop up the head of your bed 4 to 6 inches by putting bricks under the legs of the bed.  ?? Treat breathing problems, such as a stuffy nose, caused by a cold or allergies.   ?? Try a continuous positive airway pressure (CPAP) breathing machine if your doctor recommends it. The machine keeps your airway open when you sleep.  ?? If CPAP does not work for you, ask your doctor if you can try other breathing machines. A bilevel positive airway pressure machine uses one type of air pressure for breathing in and another type for breathing out. Another device raises or lowers air pressure as needed while you breathe.  ?? Talk to your doctor if:  ? Your nose feels dry or bleeds when you use one of these machines. You may need to increase moisture in the air. A humidifier may help.  ? Your nose is runny or stuffy from using a breathing machine. Decongestants or a corticosteroid nasal spray may help.  ? You are sleepy during the day and it gets in the way of the normal things you do. Do not drive when you are drowsy.  When should you call for help?  Watch closely for changes in your health, and be sure to contact your doctor if:  ?? ?? You still have sleep apnea even though you have made lifestyle changes.   ?? ?? You are thinking of trying a device such as CPAP.   ?? ?? You are having problems using a CPAP or similar machine.   Where can you learn   more?  Go to http://www.healthwise.net/GoodHelpConnections.  Enter J936 in the search box to learn more about "Sleep Apnea: Care Instructions."  Current as of: March 24, 2016  Content Version: 11.8  ?? 2006-2018 [employer]. Care instructions adapted under license by [employer] (which disclaims liability or warranty for this information). If you have questions about a medical condition or this instruction, always ask your healthcare professional. [employer] disclaims any warranty or liability for your use of this information.

## 2017-04-07 NOTE — Progress Notes (Deleted)
[address]  [place], [place] [id number]  Tel.  [phone]  Fax. [phone] [address]  [place], [place] [id number]  Tel.  [phone]  Fax. [phone] [address]  [place], [place] [id number]  Tel.  [phone]  Fax. [phone]         Chief Complaint       Chief Complaint   Patient presents with   ??? Other     Patient had to reschedule         HPI        Alejandro Cohen is a  56 y.o.  male seen for follow-up. The patient reports he has experienced daytime sleepiness, snoring, nocturnal awakening, witnessed apnea.  He has a many year history of snoring described as loud.  Snoring is heard in separate rooms through closed doors.  He has been told of associated apnea.  Snoring may be more pronounced when supine.       He was evaluated with a home sleep test.HSAT demonstrated severe sleep disordered breathing characterized by an overall AHI of 30.6/h associated with minimal SaO2 of 73%.  Events were more prominent supine with the supine???related AHI of 73.2/h.  Snoring noted during 53.3% of the recording.    APAP 8-15 cm was started.    Compliance data downloaded and reviewed in detail with the patient today. During the past 30 days, APAP used during 26 days with the average daily use of 4.85 hours. CMS compliance criteria 73%. AHI 1.1 per hour.     Max pressure 13 cm; 95th % 12 cm.    He notes he is feeling rested with APAP    No Known Allergies    Current Outpatient Medications   Medication Sig Dispense Refill   ??? azithromycin (ZITHROMAX) 250 mg tablet 2 tabs today and then 1 tab daily for 4 days 6 Tab 0   ??? ketoconazole (NIZORAL) 2 % topical cream APPLY TO AFFECTED AREA(S) ONCE DAILY  0   ??? red yeast rice extract 600 mg cap Take 600 mg by mouth now.     ??? aspirin delayed-release 81 mg tablet Take  by mouth daily.     ??? multivitamin (ONE A DAY) tablet Take 1 Tab by mouth daily.     ??? Omega-3 Fatty Acids (FISH OIL) 300 mg cap Take  by mouth.           He  has a past medical history of Contact dermatitis and other eczema, due to unspecified cause.    He  has no past surgical history on file.    He family history includes Alcohol abuse in his father, maternal grandfather, maternal grandmother, paternal grandfather, and paternal grandmother; Diabetes in his father and mother; Elevated Lipids in his father and mother; Heart Disease in his father and mother; Hypertension in his father and mother; Migraines in his mother; Psychiatric Disorder in his mother; Stroke in his father and mother.    He  reports that he quit smoking about 29 years ago. he has never used smokeless tobacco. He reports that he does not drink alcohol or use drugs.     Review of Systems:  Unchanged per patient      Objective:   There were no vitals taken for this visit.  There is no height or weight on file to calculate BMI.       Assessment:       ICD-10-CM ICD-9-CM    1. OSA (obstructive sleep apnea)  G47.33 327.23      Severe sleep disordered breathing responding well to APAP 8-15 cm.  He will continue at the current pressure settings.    Plan:   No orders of the defined types were placed in this encounter.      * Patient has a history and examination consistent with the diagnosis of sleep apnea.   * He was provided information on sleep apnea including coresponding risk factors and the importance of proper treatment.   * Treatment options if indicated were reviewed today.   [name].      [name] L. [name], MD, FAASM  Electronically signed 04/16/17        This note was created using voice recognition software. Despite editing, there may be syntax errors.  This note will not be viewable in MyChart.

## 2017-04-20 ENCOUNTER — Ambulatory Visit: Admit: 2017-04-20 | Payer: PRIVATE HEALTH INSURANCE | Attending: Specialist | Primary: Internal Medicine

## 2017-04-20 ENCOUNTER — Encounter: Attending: Specialist | Primary: Internal Medicine

## 2017-04-20 DIAGNOSIS — G4733 Obstructive sleep apnea (adult) (pediatric): Secondary | ICD-10-CM

## 2017-04-20 NOTE — Patient Instructions (Addendum)
Sleep Apnea: Care Instructions  Your Care Instructions    Sleep apnea means that you frequently stop breathing for 10 seconds or longer during sleep. It can be mild to severe, based on the number of times an hour that you stop breathing or have slowed breathing.  Blocked or narrowed airways in your nose, mouth, or throat can cause sleep apnea. Your airway can become blocked when your throat muscles and tongue relax during sleep.  You can treat sleep apnea at home by making lifestyle changes. You also can use a CPAP breathing machine that keeps tissues in the throat from blocking your airway. Or your doctor may suggest that you use a breathing device while you sleep. It helps keep your airway open. This could be a device that you put in your mouth. In some cases, surgery may be needed to remove enlarged tissues in the throat.  Follow-up care is a key part of your treatment and safety. Be sure to make and go to all appointments, and call your doctor if you are having problems. It's also a good idea to know your test results and keep a list of the medicines you take.  How can you care for yourself at home?  ?? Lose weight, if needed. It may reduce the number of times you stop breathing or have slowed breathing.  ?? Sleep on your side. It may stop mild apnea. If you tend to roll onto your back, sew a pocket in the back of your pajama top. Put a tennis ball into the pocket, and stitch the pocket shut. This will help keep you from sleeping on your back.  ?? Avoid alcohol and medicines such as sleeping pills and sedatives before bed.  ?? Do not smoke. Smoking can make sleep apnea worse. If you need help quitting, talk to your doctor about stop-smoking programs and medicines. These can increase your chances of quitting for good.  ?? Prop up the head of your bed 4 to 6 inches by putting bricks under the legs of the bed.  ?? Treat breathing problems, such as a stuffy nose, caused by a cold or allergies.   ?? Try a continuous positive airway pressure (CPAP) breathing machine if your doctor recommends it. The machine keeps your airway open when you sleep.  ?? If CPAP does not work for you, ask your doctor if you can try other breathing machines. A bilevel positive airway pressure machine uses one type of air pressure for breathing in and another type for breathing out. Another device raises or lowers air pressure as needed while you breathe.  ?? Talk to your doctor if:  ? Your nose feels dry or bleeds when you use one of these machines. You may need to increase moisture in the air. A humidifier may help.  ? Your nose is runny or stuffy from using a breathing machine. Decongestants or a corticosteroid nasal spray may help.  ? You are sleepy during the day and it gets in the way of the normal things you do. Do not drive when you are drowsy.  When should you call for help?  Watch closely for changes in your health, and be sure to contact your doctor if:  ?? ?? You still have sleep apnea even though you have made lifestyle changes.   ?? ?? You are thinking of trying a device such as CPAP.   ?? ?? You are having problems using a CPAP or similar machine.   Where can you learn   more?  Go to http://www.healthwise.net/GoodHelpConnections.  Enter J936 in the search box to learn more about "Sleep Apnea: Care Instructions."  Current as of: March 24, 2016  Content Version: 11.8  ?? 2006-2018 [employer]. Care instructions adapted under license by [employer] (which disclaims liability or warranty for this information). If you have questions about a medical condition or this instruction, always ask your healthcare professional. [employer] disclaims any warranty or liability for your use of this information.

## 2017-04-20 NOTE — Progress Notes (Signed)
[address] [place], [place] [address]  Tel.  [phone]  Fax. [phone] [address]  [place], [place] [address]  Tel.  [phone]  Fax. [phone] [address]  [place], [place] [address]  Tel.  [phone]  Fax. [phone]         Chief Complaint       Chief Complaint   Patient presents with   ??? Sleep Problem         HPI        Alejandro Cohen is a  57 y.o.  male seen for follow-up.  ??He was evaluated with a home sleep test.HSAT demonstrated severe sleep disordered breathing characterized by an overall AHI of 30.6/h associated with minimal SaO2 of 73%. ??Events were more prominent supine with the supine???related AHI of 73.2/h. ??Snoring noted during 53.3% of the recording.  ??  APAP 8-15 cm was started.    He was seen December 2018 demonstrating compliance. He is uncertain as to his DME company and presents today. He has skipped a few days over the holidays but otherwise continues to use regularly. He feels rested and is no longer snoring.      No Known Allergies    Current Outpatient Medications   Medication Sig Dispense Refill   ??? azithromycin (ZITHROMAX) 250 mg tablet 2 tabs today and then 1 tab daily for 4 days 6 Tab 0   ??? ketoconazole (NIZORAL) 2 % topical cream APPLY TO AFFECTED AREA(S) ONCE DAILY  0   ??? red yeast rice extract 600 mg cap Take 600 mg by mouth now.     ??? aspirin delayed-release 81 mg tablet Take  by mouth daily.     ??? multivitamin (ONE A DAY) tablet Take 1 Tab by mouth daily.     ??? Omega-3 Fatty Acids (FISH OIL) 300 mg cap Take  by mouth.          He  has a past medical history of Contact dermatitis and other eczema, due to unspecified cause.    He  has no past surgical history on file.    He family history includes Alcohol abuse in his father, maternal grandfather, maternal grandmother, paternal grandfather, and paternal grandmother; Diabetes in his father and mother; Elevated Lipids in his  father and mother; Heart Disease in his father and mother; Hypertension in his father and mother; Migraines in his mother; Psychiatric Disorder in his mother; Stroke in his father and mother.    He  reports that he quit smoking about 29 years ago. he has never used smokeless tobacco. He reports that he does not drink alcohol or use drugs.     Review of Systems:  Unchanged per patient      Objective:     Visit Vitals  BP 127/79 (BP 1 Location: Left arm, BP Patient Position: Sitting)   Pulse 74   Resp 16   Ht 5\' 9"  (1.753 m)   Wt 240 lb (108.9 kg)   SpO2 95%   BMI 35.44 kg/m??     Body mass index is 35.44 kg/m??.       Assessment:       ICD-10-CM ICD-9-CM    1. OSA (obstructive sleep apnea) G47.33 327.23      Sleep disordered breathing responding to APAP. He will continue on the current pressure settings.     Plan:   No orders of the defined types were placed in this encounter.      *  Patient has a history and examination consistent with the diagnosis of sleep apnea.  * He was provided information on sleep apnea including coresponding risk factors and the importance of proper treatment.   * Treatment options if indicated were reviewed today.    Potential benefit of weight reduction was discussed.  Weight reduction techniques reviewed.      Valla LeaverBarry L. Nakya Weyand, MD, FAASM  Electronically signed 04/20/17        This note was created using voice recognition software. Despite editing, there may be syntax errors.  This note will not be viewable in MyChart.

## 2017-05-09 ENCOUNTER — Ambulatory Visit
Admit: 2017-05-09 | Discharge: 2017-05-09 | Payer: PRIVATE HEALTH INSURANCE | Attending: Internal Medicine | Primary: Internal Medicine

## 2017-05-09 DIAGNOSIS — M7061 Trochanteric bursitis, right hip: Secondary | ICD-10-CM

## 2017-05-09 MED ORDER — IBUPROFEN 800 MG TAB
800 mg | ORAL_TABLET | Freq: Two times a day (BID) | ORAL | 12 refills | Status: DC
Start: 2017-05-09 — End: 2018-11-30

## 2017-05-09 NOTE — Progress Notes (Signed)
Alejandro Cohen is a 57 y.o. male and presents with Hip Pain  .  Subjective:    He has pains involving the rt.iliac crest region    Review of Systems  Constitutional: negative for fevers, chills, anorexia and weight loss  Eyes:   negative for visual disturbance and irritation  ENT:   negative for tinnitus,sore throat,nasal congestion,ear pains.hoarseness  Respiratory:  negative for cough, hemoptysis, dyspnea,wheezing  CV:   negative for chest pain, palpitations, lower extremity edema  GI:   negative for nausea, vomiting, diarrhea, abdominal pain,melena  Endo:               negative for polyuria,polydipsia,polyphagia,heat intolerance  Genitourinary: negative for frequency, dysuria and hematuria  Integument:  negative for rash and pruritus  Hematologic:  negative for easy bruising and gum/nose bleeding  Musculoskel: negative for myalgias, arthralgias, back pain, muscle weakness, joint pain  Neurological:  negative for headaches, dizziness, vertigo, memory problems and gait   Behavl/Psych: negative for feelings of anxiety, depression, mood changes    Past Medical History:   Diagnosis Date   ??? Contact dermatitis and other eczema, due to unspecified cause     acne     History reviewed. No pertinent surgical history.  Social History     Socioeconomic History   ??? Marital status: MARRIED     Spouse name: Not on file   ??? Number of children: Not on file   ??? Years of education: Not on file   ??? Highest education level: Not on file   Tobacco Use   ??? Smoking status: Former Smoker     Last attempt to quit: 10/09/1987     Years since quitting: 29.6   ??? Smokeless tobacco: Never Used   ??? Tobacco comment: smoked for 3 yrs of  and on   Substance and Sexual Activity   ??? Alcohol use: No   ??? Drug use: No   ??? Sexual activity: Yes     Partners: Female     Birth control/protection: None     Family History   Problem Relation Age of Onset   ??? Diabetes Mother    ??? Elevated Lipids Mother    ??? Migraines Mother    ??? Heart Disease Mother     ??? Hypertension Mother    ??? Psychiatric Disorder Mother    ??? Stroke Mother    ??? Alcohol abuse Father    ??? Diabetes Father    ??? Elevated Lipids Father    ??? Heart Disease Father    ??? Hypertension Father    ??? Stroke Father    ??? Alcohol abuse Maternal Grandmother    ??? Alcohol abuse Maternal Grandfather    ??? Alcohol abuse Paternal Grandmother    ??? Alcohol abuse Paternal Grandfather      Current Outpatient Medications   Medication Sig Dispense Refill   ??? ibuprofen (MOTRIN) 800 mg tablet Take 1 Tab by mouth two (2) times daily (after meals). 60 Tab 12   ??? ketoconazole (NIZORAL) 2 % topical cream APPLY TO AFFECTED AREA(S) ONCE DAILY  0   ??? red yeast rice extract 600 mg cap Take 600 mg by mouth now.     ??? aspirin delayed-release 81 mg tablet Take  by mouth daily.     ??? multivitamin (ONE A DAY) tablet Take 1 Tab by mouth daily.     ??? Omega-3 Fatty Acids (FISH OIL) 300 mg cap Take  by mouth.  No Known Allergies    Objective:  Visit Vitals  BP 110/70 (BP 1 Location: Right arm, BP Patient Position: Sitting)   Pulse 75   Temp 98.5 ??F (36.9 ??C) (Oral)   Resp 18   Ht 5\' 9"  (1.753 m)   Wt 240 lb (108.9 kg)   SpO2 93%   BMI 35.44 kg/m??     Physical Exam:   General appearance - alert, well appearing, and in no distress  Mental status - alert, oriented to person, place, and time  EYE-PERLA, EOMI, corneas normal, no foreign bodies  ENT-ENT exam normal, no neck nodes or sinus tenderness  Nose - normal and patent, no erythema, discharge or polyps  Mouth - mucous membranes moist, pharynx normal without lesions  Neck - supple, no significant adenopathy   Chest - clear to auscultation, no wheezes, rales or rhonchi, symmetric air entry   Heart - normal rate, regular rhythm, normal S1, S2, no murmurs, rubs, clicks or gallops   Abdomen - soft, nontender, nondistended, no masses or organomegaly  Lymph- no adenopathy palpable  Ext-peripheral pulses normal, no pedal edema, no clubbing or cyanosis   Skin-Warm and dry. no hyperpigmentation, vitiligo, or suspicious lesions  Neuro -alert, oriented, normal speech, no focal findings or movement disorder noted  Neck-normal C-spine, no tenderness, full ROM without pain  Feet-no nail deformities or callus formation with good pulses noted  Rt.iliac and trochanteric tenderness tenderness    Results for orders placed or performed in visit on 03/08/16   AMB POC LIPID PROFILE   Result Value Ref Range    Cholesterol (POC) 200     Triglycerides (POC) 140     HDL Cholesterol (POC) 62     Non-HDL Cholesterol 139     LDL Cholesterol (POC) 111 MG/DL    TChol/HDL Ratio (POC) 3.2        Assessment/Plan:    ICD-10-CM ICD-9-CM    1. Trochanteric bursitis of right hip M70.61 726.5 CBC W/O DIFF      METABOLIC PANEL, COMPREHENSIVE      XR HIP RT W OR WO PELV 2-3 VWS   2. Screening for colon cancer Z12.11 V76.51 OCCULT BLOOD IMMUNOASSAY,DIAGNOSTIC     Orders Placed This Encounter   ??? XR HIP RT W OR WO PELV 2-3 VWS     Standing Status:   Future     Standing Expiration Date:   06/09/2018     Order Specific Question:   Reason for Exam     Answer:   hip pain     Order Specific Question:   Is Patient Allergic to Contrast Dye?     Answer:   No   ??? OCCULT BLOOD IMMUNOASSAY,DIAGNOSTIC   ??? CBC W/O DIFF   ??? METABOLIC PANEL, COMPREHENSIVE   ??? ibuprofen (MOTRIN) 800 mg tablet     Sig: Take 1 Tab by mouth two (2) times daily (after meals).     Dispense:  60 Tab     Refill:  12     lose weight, increase physical activity, follow low fat diet, follow low salt diet, continue present plan,Take 81mg  aspirin daily  Patient Instructions   MyChart Activation    Thank you for requesting access to MyChart. Please follow the instructions below to securely access and download your online medical record. MyChart allows you to send messages to your doctor, view your test results, renew your prescriptions, schedule appointments, and more.    How Do I Sign Up?  1. In your internet browser, go to [email or web address]  2. Click on the First Time User? Click Here link in the Sign In box. You will be redirect to the New Member Sign Up page.  3. Enter your MyChart Access Code exactly as it appears below. You will not need to use this code after you???ve completed the sign-up process. If you do not sign up before the expiration date, you must request a new code.    MyChart Access Code: Activation code not generated  Current MyChart Status: Active (This is the date your MyChart access code will expire)    4. Enter the last four digits of your Social Security Number (xxxx) and Date of Birth (mm/dd/yyyy) as indicated and click Submit. You will be taken to the next sign-up page.  5. Create a MyChart ID. This will be your MyChart login ID and cannot be changed, so think of one that is secure and easy to remember.  6. Create a MyChart password. You can change your password at any time.  7. Enter your Password Reset Question and Answer. This can be used at a later time if you forget your password.   8. Enter your e-mail address. You will receive e-mail notification when new information is available in MyChart.  9. Click Sign Up. You can now view and download portions of your medical record.  10. Click the Download Summary menu link to download a portable copy of your medical information.    Additional Information    If you have questions, please visit the Frequently Asked Questions section of the MyChart website at [email or web address] Remember, MyChart is NOT to be used for urgent needs. For medical emergencies, dial 911.           Follow-up Disposition:  Return in about 4 weeks (around 06/06/2017), or if symptoms worsen or fail to improve.      I have reviewed with the patient details of the assessment and plan and all questions were answered. Relevent patient education was performed.The most recent lab findings were reviewed with the patient.     An After Visit Summary was printed and given to the patient.

## 2017-05-09 NOTE — Patient Instructions (Signed)
MyChart Activation    Thank you for requesting access to MyChart. Please follow the instructions below to securely access and download your online medical record. MyChart allows you to send messages to your doctor, view your test results, renew your prescriptions, schedule appointments, and more.    How Do I Sign Up?    1. In your internet browser, go to [email or web address]  2. Click on the First Time User? Click Here link in the Sign In box. You will be redirect to the New Member Sign Up page.  3. Enter your MyChart Access Code exactly as it appears below. You will not need to use this code after you???ve completed the sign-up process. If you do not sign up before the expiration date, you must request a new code.    MyChart Access Code: Activation code not generated  Current MyChart Status: Active (This is the date your MyChart access code will expire)    4. Enter the last four digits of your Social Security Number (xxxx) and Date of Birth (mm/dd/yyyy) as indicated and click Submit. You will be taken to the next sign-up page.  5. Create a MyChart ID. This will be your MyChart login ID and cannot be changed, so think of one that is secure and easy to remember.  6. Create a MyChart password. You can change your password at any time.  7. Enter your Password Reset Question and Answer. This can be used at a later time if you forget your password.   8. Enter your e-mail address. You will receive e-mail notification when new information is available in MyChart.  9. Click Sign Up. You can now view and download portions of your medical record.  10. Click the Download Summary menu link to download a portable copy of your medical information.    Additional Information    If you have questions, please visit the Frequently Asked Questions section of the MyChart website at [email or web address] Remember, MyChart is NOT to be used for urgent needs. For medical emergencies, dial 911.

## 2017-05-10 ENCOUNTER — Inpatient Hospital Stay: Admit: 2017-05-10 | Payer: BLUE CROSS/BLUE SHIELD | Primary: Internal Medicine

## 2017-05-10 DIAGNOSIS — M7061 Trochanteric bursitis, right hip: Secondary | ICD-10-CM

## 2017-06-09 ENCOUNTER — Encounter: Attending: Internal Medicine | Primary: Internal Medicine

## 2017-06-17 ENCOUNTER — Ambulatory Visit: Admit: 2017-06-17 | Payer: PRIVATE HEALTH INSURANCE | Attending: Internal Medicine | Primary: Internal Medicine

## 2017-06-17 ENCOUNTER — Encounter: Attending: Internal Medicine | Primary: Internal Medicine

## 2017-06-17 DIAGNOSIS — Z0000 Encounter for general adult medical examination without abnormal findings: Secondary | ICD-10-CM

## 2017-06-17 LAB — AMB POC LIPID PROFILE
Cholesterol (POC): 182
HDL Cholesterol (POC): 47
LDL Cholesterol (POC): 113 MG/DL
Non-HDL Cholesterol: 135
TChol/HDL Ratio (POC): 3.9
Triglycerides (POC): 110

## 2017-06-17 LAB — AMB POC GLUCOSE BLOOD, BY GLUCOSE MONITORING DEVICE: Glucose POC: 97 mg/dL

## 2017-06-17 LAB — AMB POC HEMOGLOBIN A1C: Hemoglobin A1c (POC): 5.5 %

## 2017-06-17 NOTE — Progress Notes (Signed)
Alejandro Cohen is a 57 y.o. male and presents with Complete Physical  .  Subjective:    He needs a complete exam.    He offers no new complaints    He has less leg pains reported.    Health maintenance suggests the needs for a test for occult blood      Review of Systems  Constitutional: negative for fevers, chills, anorexia and weight loss  Eyes:   negative for visual disturbance and irritation  ENT:   negative for tinnitus,sore throat,nasal congestion,ear pains.hoarseness  Respiratory:  negative for cough, hemoptysis, dyspnea,wheezing  CV:   negative for chest pain, palpitations, lower extremity edema  GI:   negative for nausea, vomiting, diarrhea, abdominal pain,melena  Endo:               negative for polyuria,polydipsia,polyphagia,heat intolerance  Genitourinary: negative for frequency, dysuria and hematuria  Integument:  negative for rash and pruritus  Hematologic:  negative for easy bruising and gum/nose bleeding  Musculoskel: negative for myalgias, arthralgias, back pain, muscle weakness, joint pain  Neurological:  negative for headaches, dizziness, vertigo, memory problems and gait   Behavl/Psych: negative for feelings of anxiety, depression, mood changes    Past Medical History:   Diagnosis Date   ??? Contact dermatitis and other eczema, due to unspecified cause     acne     History reviewed. No pertinent surgical history.  Social History     Socioeconomic History   ??? Marital status: MARRIED     Spouse name: Not on file   ??? Number of children: Not on file   ??? Years of education: Not on file   ??? Highest education level: Not on file   Tobacco Use   ??? Smoking status: Former Smoker     Last attempt to quit: 10/09/1987     Years since quitting: 29.7   ??? Smokeless tobacco: Never Used   ??? Tobacco comment: smoked for 3 yrs of  and on   Substance and Sexual Activity   ??? Alcohol use: No   ??? Drug use: No   ??? Sexual activity: Yes     Partners: Female     Birth control/protection: None     Family History    Problem Relation Age of Onset   ??? Diabetes Mother    ??? Elevated Lipids Mother    ??? Migraines Mother    ??? Heart Disease Mother    ??? Hypertension Mother    ??? Psychiatric Disorder Mother    ??? Stroke Mother    ??? Alcohol abuse Father    ??? Diabetes Father    ??? Elevated Lipids Father    ??? Heart Disease Father    ??? Hypertension Father    ??? Stroke Father    ??? Alcohol abuse Maternal Grandmother    ??? Alcohol abuse Maternal Grandfather    ??? Alcohol abuse Paternal Grandmother    ??? Alcohol abuse Paternal Grandfather      Current Outpatient Medications   Medication Sig Dispense Refill   ??? aspirin delayed-release 81 mg tablet Take  by mouth daily.     ??? ibuprofen (MOTRIN) 800 mg tablet Take 1 Tab by mouth two (2) times daily (after meals). (Patient not taking: Reported on 06/17/2017) 60 Tab 12   ??? ketoconazole (NIZORAL) 2 % topical cream APPLY TO AFFECTED AREA(S) ONCE DAILY  0   ??? red yeast rice extract 600 mg cap Take 600 mg by mouth now.     ???  multivitamin (ONE A DAY) tablet Take 1 Tab by mouth daily.     ??? Omega-3 Fatty Acids (FISH OIL) 300 mg cap Take  by mouth.       No Known Allergies    Objective:  Visit Vitals  BP 100/68   Pulse 69   Temp 97.9 ??F (36.6 ??C) (Oral)   Resp 16   Ht 5\' 9"  (1.753 m)   Wt 240 lb (108.9 kg)   SpO2 98%   BMI 35.44 kg/m??     Physical Exam:   General appearance - alert, well appearing, and in no distress  Mental status - alert, oriented to person, place, and time  EYE-PERLA, EOMI, corneas normal, no foreign bodies  ENT-ENT exam normal, no neck nodes or sinus tenderness  Nose - normal and patent, no erythema, discharge or polyps  Mouth - mucous membranes moist, pharynx normal without lesions  Neck - supple, no significant adenopathy   Chest - clear to auscultation, no wheezes, rales or rhonchi, symmetric air entry   Heart - normal rate, regular rhythm, normal S1, S2, no murmurs, rubs, clicks or gallops   Abdomen - soft, nontender, nondistended, no masses or organomegaly  Lymph- no adenopathy palpable   Ext-peripheral pulses normal, no pedal edema, no clubbing or cyanosis  Skin-Warm and dry. no hyperpigmentation, vitiligo, or suspicious lesions  Neuro -alert, oriented, normal speech, no focal findings or movement disorder noted  Neck-normal C-spine, no tenderness, full ROM without pain  Feet-no nail deformities or callus formation with good pulses noted      Results for orders placed or performed in visit on 06/17/17   AMB POC GLUCOSE BLOOD, BY GLUCOSE MONITORING DEVICE   Result Value Ref Range    Glucose POC 97 mg/dL   AMB POC HEMOGLOBIN Z6X   Result Value Ref Range    Hemoglobin A1c (POC) 5.5 %   AMB POC LIPID PROFILE   Result Value Ref Range    Cholesterol (POC) 182     Triglycerides (POC) 110     HDL Cholesterol (POC) 47     Non-HDL Cholesterol 135     LDL Cholesterol (POC) 113 MG/DL    TChol/HDL Ratio (POC) 3.9        Assessment/Plan:    ICD-10-CM ICD-9-CM    1. Laboratory tests ordered as part of a complete physical exam (CPE) Z00.00 V72.62 AMB POC GLUCOSE BLOOD, BY GLUCOSE MONITORING DEVICE      AMB POC HEMOGLOBIN A1C      AMB POC LIPID PROFILE   2. Screen for colon cancer Z12.11 V76.51 OCCULT BLOOD IMMUNOASSAY,DIAGNOSTIC   3. Elevated LDL cholesterol level E78.00 272.0      Orders Placed This Encounter   ??? OCCULT BLOOD IMMUNOASSAY,DIAGNOSTIC   ??? AMB POC GLUCOSE BLOOD, BY GLUCOSE MONITORING DEVICE   ??? AMB POC HEMOGLOBIN A1C   ??? AMB POC LIPID PROFILE     lose weight, increase physical activity, follow low fat diet, follow low salt diet, continue present plan, routine labs ordered,Take 81mg  aspirin daily  Patient Instructions   MyChart Activation    Thank you for requesting access to MyChart. Please follow the instructions below to securely access and download your online medical record. MyChart allows you to send messages to your doctor, view your test results, renew your prescriptions, schedule appointments, and more.    How Do I Sign Up?    1. In your internet browser, go to [email or web address]   2. Click on the First Time User?  Click Here link in the Sign In box. You will be redirect to the New Member Sign Up page.  3. Enter your MyChart Access Code exactly as it appears below. You will not need to use this code after you???ve completed the sign-up process. If you do not sign up before the expiration date, you must request a new code.    MyChart Access Code: Activation code not generated  Current MyChart Status: Active (This is the date your MyChart access code will expire)    4. Enter the last four digits of your Social Security Number (xxxx) and Date of Birth (mm/dd/yyyy) as indicated and click Submit. You will be taken to the next sign-up page.  5. Create a MyChart ID. This will be your MyChart login ID and cannot be changed, so think of one that is secure and easy to remember.  6. Create a MyChart password. You can change your password at any time.  7. Enter your Password Reset Question and Answer. This can be used at a later time if you forget your password.   8. Enter your e-mail address. You will receive e-mail notification when new information is available in MyChart.  9. Click Sign Up. You can now view and download portions of your medical record.  10. Click the Download Summary menu link to download a portable copy of your medical information.    Additional Information    If you have questions, please visit the Frequently Asked Questions section of the MyChart website at [email or web address] Remember, MyChart is NOT to be used for urgent needs. For medical emergencies, dial 911.           Follow-up Disposition:  Return in about 6 months (around 12/18/2017), or if symptoms worsen or fail to improve.      I have reviewed with the patient details of the assessment and plan and all questions were answered. Relevent patient education was performed.The most recent lab findings were reviewed with the patient.    An After Visit Summary was printed and given to the patient.

## 2017-06-17 NOTE — Patient Instructions (Signed)
MyChart Activation    Thank you for requesting access to MyChart. Please follow the instructions below to securely access and download your online medical record. MyChart allows you to send messages to your doctor, view your test results, renew your prescriptions, schedule appointments, and more.    How Do I Sign Up?    1. In your internet browser, go to [email or web address]  2. Click on the First Time User? Click Here link in the Sign In box. You will be redirect to the New Member Sign Up page.  3. Enter your MyChart Access Code exactly as it appears below. You will not need to use this code after you???ve completed the sign-up process. If you do not sign up before the expiration date, you must request a new code.    MyChart Access Code: Activation code not generated  Current MyChart Status: Active (This is the date your MyChart access code will expire)    4. Enter the last four digits of your Social Security Number (xxxx) and Date of Birth (mm/dd/yyyy) as indicated and click Submit. You will be taken to the next sign-up page.  5. Create a MyChart ID. This will be your MyChart login ID and cannot be changed, so think of one that is secure and easy to remember.  6. Create a MyChart password. You can change your password at any time.  7. Enter your Password Reset Question and Answer. This can be used at a later time if you forget your password.   8. Enter your e-mail address. You will receive e-mail notification when new information is available in MyChart.  9. Click Sign Up. You can now view and download portions of your medical record.  10. Click the Download Summary menu link to download a portable copy of your medical information.    Additional Information    If you have questions, please visit the Frequently Asked Questions section of the MyChart website at [email or web address] Remember, MyChart is NOT to be used for urgent needs. For medical emergencies, dial 911.

## 2017-06-17 NOTE — Progress Notes (Signed)
1. Have you been to the ER, urgent care clinic since your last visit?  Hospitalized since your last visit?no    2. Have you seen or consulted any other health care providers outside of the [facility] since your last visit?  Include any pap smears or colon screening. No    3 most recent PHQ Screens 05/09/2017   PHQ Not Done -   Little interest or pleasure in doing things Not at all   Feeling down, depressed, irritable, or hopeless Not at all   Total Score PHQ 2 0     Chief Complaint   Patient presents with   ??? Complete Physical     Per Dr. Harold T.  Green Jr.,  verbal order given for needed amb poc labs.

## 2017-12-23 ENCOUNTER — Ambulatory Visit: Attending: Internal Medicine | Primary: Internal Medicine

## 2017-12-23 ENCOUNTER — Ambulatory Visit
Admit: 2017-12-23 | Discharge: 2017-12-23 | Payer: PRIVATE HEALTH INSURANCE | Attending: Internal Medicine | Primary: Internal Medicine

## 2017-12-23 DIAGNOSIS — B360 Pityriasis versicolor: Secondary | ICD-10-CM

## 2017-12-23 MED ORDER — TERBINAFINE 250 MG TAB
250 mg | ORAL_TABLET | Freq: Every day | ORAL | 0 refills | Status: DC
Start: 2017-12-23 — End: 2018-11-30

## 2017-12-23 NOTE — Progress Notes (Signed)
Alejandro Cohen is a 57 y.o. male and presents with Rash  .  Subjective:  He has a new rash on the skin region  He was told that he had ring worms and placed on ketoconazole      Review of Systems  Constitutional: negative for fevers, chills, anorexia and weight loss  Eyes:   negative for visual disturbance and irritation  ENT:   negative for tinnitus,sore throat,nasal congestion,ear pains.hoarseness  Respiratory:  negative for cough, hemoptysis, dyspnea,wheezing  CV:   negative for chest pain, palpitations, lower extremity edema  GI:   negative for nausea, vomiting, diarrhea, abdominal pain,melena  Endo:               negative for polyuria,polydipsia,polyphagia,heat intolerance  Genitourinary: negative for frequency, dysuria and hematuria  Integument:  rash and pruritus  Hematologic:  negative for easy bruising and gum/nose bleeding  Musculoskel: negative for myalgias, arthralgias, back pain, muscle weakness, joint pain  Neurological:  negative for headaches, dizziness, vertigo, memory problems and gait   Behavl/Psych: negative for feelings of anxiety, depression, mood changes    Past Medical History:   Diagnosis Date   ??? Contact dermatitis and other eczema, due to unspecified cause     acne     History reviewed. No pertinent surgical history.  Social History     Socioeconomic History   ??? Marital status: MARRIED     Spouse name: Not on file   ??? Number of children: Not on file   ??? Years of education: Not on file   ??? Highest education level: Not on file   Tobacco Use   ??? Smoking status: Former Smoker     Last attempt to quit: 10/09/1987     Years since quitting: 30.2   ??? Smokeless tobacco: Never Used   ??? Tobacco comment: smoked for 3 yrs of  and on   Substance and Sexual Activity   ??? Alcohol use: No   ??? Drug use: No   ??? Sexual activity: Yes     Partners: Female     Birth control/protection: None     Family History   Problem Relation Age of Onset   ??? Diabetes Mother    ??? Elevated Lipids Mother    ??? Migraines Mother     ??? Heart Disease Mother    ??? Hypertension Mother    ??? Psychiatric Disorder Mother    ??? Stroke Mother    ??? Alcohol abuse Father    ??? Diabetes Father    ??? Elevated Lipids Father    ??? Heart Disease Father    ??? Hypertension Father    ??? Stroke Father    ??? Alcohol abuse Maternal Grandmother    ??? Alcohol abuse Maternal Grandfather    ??? Alcohol abuse Paternal Grandmother    ??? Alcohol abuse Paternal Grandfather      Current Outpatient Medications   Medication Sig Dispense Refill   ??? ketoconazole (NIZORAL) 2 % topical cream Apply  to affected area daily.     ??? terbinafine HCl (LAMISIL) 250 mg tablet Take 1 Tab by mouth daily. 45 Tab 0   ??? ketoconazole (NIZORAL) 2 % topical cream APPLY TO AFFECTED AREA(S) ONCE DAILY  0   ??? ibuprofen (MOTRIN) 800 mg tablet Take 1 Tab by mouth two (2) times daily (after meals). (Patient not taking: Reported on 06/17/2017) 60 Tab 12   ??? red yeast rice extract 600 mg cap Take 600 mg by mouth now.     ???  aspirin delayed-release 81 mg tablet Take  by mouth daily.     ??? multivitamin (ONE A DAY) tablet Take 1 Tab by mouth daily.     ??? Omega-3 Fatty Acids (FISH OIL) 300 mg cap Take  by mouth.       No Known Allergies    Objective:  Visit Vitals  BP 110/70 (BP 1 Location: Right arm, BP Patient Position: Sitting)   Pulse 70   Temp 97.4 ??F (36.3 ??C) (Oral)   Resp 20   Ht 5\' 9"  (1.753 m)   Wt 235 lb (106.6 kg)   SpO2 95%   BMI 34.70 kg/m??     Physical Exam:   General appearance - alert, well appearing, and in no distress  Mental status - alert, oriented to person, place, and time  EYE-PERLA, EOMI, corneas normal, no foreign bodies  ENT-ENT exam normal, no neck nodes or sinus tenderness  Nose - normal and patent, no erythema, discharge or polyps  Mouth - mucous membranes moist, pharynx normal without lesions  Neck - supple, no significant adenopathy   Chest - clear to auscultation, no wheezes, rales or rhonchi, symmetric air entry   Heart - normal rate, regular rhythm, normal S1, S2, no murmurs, rubs, clicks  or gallops   Abdomen - soft, nontender, nondistended, no masses or organomegaly  Lymph- no adenopathy palpable  Ext-peripheral pulses normal, no pedal edema, no clubbing or cyanosis  Skin-Warm and dry. Hypopigmentation scttered lesions, vitiligo, or suspicious lesions  Neuro -alert, oriented, normal speech, no focal findings or movement disorder noted  Neck-normal C-spine, no tenderness, full ROM without pain  Feet-no nail deformities or callus formation with good pulses noted      Results for orders placed or performed in visit on 06/17/17   AMB POC GLUCOSE BLOOD, BY GLUCOSE MONITORING DEVICE   Result Value Ref Range    Glucose POC 97 mg/dL   AMB POC HEMOGLOBIN Z6X   Result Value Ref Range    Hemoglobin A1c (POC) 5.5 %   AMB POC LIPID PROFILE   Result Value Ref Range    Cholesterol (POC) 182     Triglycerides (POC) 110     HDL Cholesterol (POC) 47     Non-HDL Cholesterol 135     LDL Cholesterol (POC) 113 MG/DL    TChol/HDL Ratio (POC) 3.9        Assessment/Plan:    ICD-10-CM ICD-9-CM    1. Tinea versicolor B36.0 111.0      Orders Placed This Encounter   ??? ketoconazole (NIZORAL) 2 % topical cream     Sig: Apply  to affected area daily.   ??? terbinafine HCl (LAMISIL) 250 mg tablet     Sig: Take 1 Tab by mouth daily.     Dispense:  45 Tab     Refill:  0     lose weight, increase physical activity, follow low fat diet, follow low salt diet, continue present plan,Take 81mg  aspirin daily  Patient Instructions   MyChart Activation    Thank you for requesting access to MyChart. Please follow the instructions below to securely access and download your online medical record. MyChart allows you to send messages to your doctor, view your test results, renew your prescriptions, schedule appointments, and more.    How Do I Sign Up?    1. In your internet browser, go to [email or web address]  2. Click on the First Time User? Click Here link in the Sign In box. You will be  redirect to the New Member Sign Up page.  3. Enter your  MyChart Access Code exactly as it appears below. You will not need to use this code after you???ve completed the sign-up process. If you do not sign up before the expiration date, you must request a new code.    MyChart Access Code: Activation code not generated  Current MyChart Status: Active (This is the date your MyChart access code will expire)    4. Enter the last four digits of your Social Security Number (xxxx) and Date of Birth (mm/dd/yyyy) as indicated and click Submit. You will be taken to the next sign-up page.  5. Create a MyChart ID. This will be your MyChart login ID and cannot be changed, so think of one that is secure and easy to remember.  6. Create a MyChart password. You can change your password at any time.  7. Enter your Password Reset Question and Answer. This can be used at a later time if you forget your password.   8. Enter your e-mail address. You will receive e-mail notification when new information is available in MyChart.  9. Click Sign Up. You can now view and download portions of your medical record.  10. Click the Download Summary menu link to download a portable copy of your medical information.    Additional Information    If you have questions, please visit the Frequently Asked Questions section of the MyChart website at [email or web address] Remember, MyChart is NOT to be used for urgent needs. For medical emergencies, dial 911.           Follow-up and Dispositions    ?? Return in about 6 weeks (around 02/03/2018), or if symptoms worsen or fail to improve.           I have reviewed with the patient details of the assessment and plan and all questions were answered. Relevent patient education was performed.The most recent lab findings were reviewed with the patient.    An After Visit Summary was printed and given to the patient.

## 2017-12-23 NOTE — Progress Notes (Signed)
 Chief Complaint   Patient presents with   . Rash     3 most recent PHQ Screens 12/23/2017   PHQ Not Done -   Little interest or pleasure in doing things Not at all   Feeling down, depressed, irritable, or hopeless Not at all   Total Score PHQ 2 0     1. Have you been to the ER, urgent care clinic since your last visit?  Hospitalized since your last visit? [facility]    2. Have you seen or consulted any other health care providers outside of the [facility] since your last visit?  Include any pap smears or colon screening. yes

## 2017-12-23 NOTE — Progress Notes (Signed)
Chief Complaint   Patient presents with   ??? Rash     3 most recent PHQ Screens 12/23/2017   PHQ Not Done -   Little interest or pleasure in doing things Not at all   Feeling down, depressed, irritable, or hopeless Not at all   Total Score PHQ 2 0     1. Have you been to the ER, urgent care clinic since your last visit?  Hospitalized since your last visit? [facility]    2. Have you seen or consulted any other health care providers outside of the [facility] since your last visit?  Include any pap smears or colon screening. yes

## 2017-12-23 NOTE — Patient Instructions (Signed)
MyChart Activation    Thank you for requesting access to MyChart. Please follow the instructions below to securely access and download your online medical record. MyChart allows you to send messages to your doctor, view your test results, renew your prescriptions, schedule appointments, and more.    How Do I Sign Up?    1. In your internet browser, go to [email or web address]  2. Click on the First Time User? Click Here link in the Sign In box. You will be redirect to the New Member Sign Up page.  3. Enter your MyChart Access Code exactly as it appears below. You will not need to use this code after you???ve completed the sign-up process. If you do not sign up before the expiration date, you must request a new code.    MyChart Access Code: Activation code not generated  Current MyChart Status: Active (This is the date your MyChart access code will expire)    4. Enter the last four digits of your Social Security Number (xxxx) and Date of Birth (mm/dd/yyyy) as indicated and click Submit. You will be taken to the next sign-up page.  5. Create a MyChart ID. This will be your MyChart login ID and cannot be changed, so think of one that is secure and easy to remember.  6. Create a MyChart password. You can change your password at any time.  7. Enter your Password Reset Question and Answer. This can be used at a later time if you forget your password.   8. Enter your e-mail address. You will receive e-mail notification when new information is available in MyChart.  9. Click Sign Up. You can now view and download portions of your medical record.  10. Click the Download Summary menu link to download a portable copy of your medical information.    Additional Information    If you have questions, please visit the Frequently Asked Questions section of the MyChart website at [email or web address] Remember, MyChart is NOT to be used for urgent needs. For medical emergencies, dial 911.

## 2017-12-23 NOTE — Progress Notes (Signed)
Alejandro Cohen is a 57 y.o. male and presents with Rash  .  Subjective:  He has a new rash on the skin region  He was told that he had ring worms and placed on ketoconazole      Review of Systems  Constitutional: negative for fevers, chills, anorexia and weight loss  Eyes:   negative for visual disturbance and irritation  ENT:   negative for tinnitus,sore throat,nasal congestion,ear pains.hoarseness  Respiratory:  negative for cough, hemoptysis, dyspnea,wheezing  CV:   negative for chest pain, palpitations, lower extremity edema  GI:   negative for nausea, vomiting, diarrhea, abdominal pain,melena  Endo:               negative for polyuria,polydipsia,polyphagia,heat intolerance  Genitourinary: negative for frequency, dysuria and hematuria  Integument:  rash and pruritus  Hematologic:  negative for easy bruising and gum/nose bleeding  Musculoskel: negative for myalgias, arthralgias, back pain, muscle weakness, joint pain  Neurological:  negative for headaches, dizziness, vertigo, memory problems and gait   Behavl/Psych: negative for feelings of anxiety, depression, mood changes    Past Medical History:   Diagnosis Date   ??? Contact dermatitis and other eczema, due to unspecified cause     acne     History reviewed. No pertinent surgical history.  Social History     Socioeconomic History   ??? Marital status: MARRIED     Spouse name: Not on file   ??? Number of children: Not on file   ??? Years of education: Not on file   ??? Highest education level: Not on file   Tobacco Use   ??? Smoking status: Former Smoker     Last attempt to quit: 10/09/1987     Years since quitting: 30.2   ??? Smokeless tobacco: Never Used   ??? Tobacco comment: smoked for 3 yrs of  and on   Substance and Sexual Activity   ??? Alcohol use: No   ??? Drug use: No   ??? Sexual activity: Yes     Partners: Female     Birth control/protection: None     Family History   Problem Relation Age of Onset   ??? Diabetes Mother    ??? Elevated Lipids Mother    ??? Migraines Mother     ??? Heart Disease Mother    ??? Hypertension Mother    ??? Psychiatric Disorder Mother    ??? Stroke Mother    ??? Alcohol abuse Father    ??? Diabetes Father    ??? Elevated Lipids Father    ??? Heart Disease Father    ??? Hypertension Father    ??? Stroke Father    ??? Alcohol abuse Maternal Grandmother    ??? Alcohol abuse Maternal Grandfather    ??? Alcohol abuse Paternal Grandmother    ??? Alcohol abuse Paternal Grandfather      Current Outpatient Medications   Medication Sig Dispense Refill   ??? ketoconazole (NIZORAL) 2 % topical cream Apply  to affected area daily.     ??? terbinafine HCl (LAMISIL) 250 mg tablet Take 1 Tab by mouth daily. 45 Tab 0   ??? ketoconazole (NIZORAL) 2 % topical cream APPLY TO AFFECTED AREA(S) ONCE DAILY  0   ??? ibuprofen (MOTRIN) 800 mg tablet Take 1 Tab by mouth two (2) times daily (after meals). (Patient not taking: Reported on 06/17/2017) 60 Tab 12   ??? red yeast rice extract 600 mg cap Take 600 mg by mouth now.     ???  aspirin delayed-release 81 mg tablet Take  by mouth daily.     ??? multivitamin (ONE A DAY) tablet Take 1 Tab by mouth daily.     ??? Omega-3 Fatty Acids (FISH OIL) 300 mg cap Take  by mouth.       No Known Allergies    Objective:  Visit Vitals  BP 110/70 (BP 1 Location: Right arm, BP Patient Position: Sitting)   Pulse 70   Temp 97.4 ??F (36.3 ??C) (Oral)   Resp 20   Ht 5\' 9"  (1.753 m)   Wt 235 lb (106.6 kg)   SpO2 95%   BMI 34.70 kg/m??     Physical Exam:   General appearance - alert, well appearing, and in no distress  Mental status - alert, oriented to person, place, and time  EYE-PERLA, EOMI, corneas normal, no foreign bodies  ENT-ENT exam normal, no neck nodes or sinus tenderness  Nose - normal and patent, no erythema, discharge or polyps  Mouth - mucous membranes moist, pharynx normal without lesions  Neck - supple, no significant adenopathy   Chest - clear to auscultation, no wheezes, rales or rhonchi, symmetric air entry   Heart - normal rate, regular rhythm, normal S1, S2, no murmurs, rubs,  clicks or gallops   Abdomen - soft, nontender, nondistended, no masses or organomegaly  Lymph- no adenopathy palpable  Ext-peripheral pulses normal, no pedal edema, no clubbing or cyanosis  Skin-Warm and dry. Hypopigmentation scttered lesions, vitiligo, or suspicious lesions  Neuro -alert, oriented, normal speech, no focal findings or movement disorder noted  Neck-normal C-spine, no tenderness, full ROM without pain  Feet-no nail deformities or callus formation with good pulses noted      Results for orders placed or performed in visit on 06/17/17   AMB POC GLUCOSE BLOOD, BY GLUCOSE MONITORING DEVICE   Result Value Ref Range    Glucose POC 97 mg/dL   AMB POC HEMOGLOBIN J4N   Result Value Ref Range    Hemoglobin A1c (POC) 5.5 %   AMB POC LIPID PROFILE   Result Value Ref Range    Cholesterol (POC) 182     Triglycerides (POC) 110     HDL Cholesterol (POC) 47     Non-HDL Cholesterol 135     LDL Cholesterol (POC) 113 MG/DL    TChol/HDL Ratio (POC) 3.9        Assessment/Plan:    ICD-10-CM ICD-9-CM    1. Tinea versicolor B36.0 111.0      Orders Placed This Encounter   ??? ketoconazole (NIZORAL) 2 % topical cream     Sig: Apply  to affected area daily.   ??? terbinafine HCl (LAMISIL) 250 mg tablet     Sig: Take 1 Tab by mouth daily.     Dispense:  45 Tab     Refill:  0     lose weight, increase physical activity, follow low fat diet, follow low salt diet, continue present plan,Take 81mg  aspirin daily  Patient Instructions   MyChart Activation    Thank you for requesting access to MyChart. Please follow the instructions below to securely access and download your online medical record. MyChart allows you to send messages to your doctor, view your test results, renew your prescriptions, schedule appointments, and more.    How Do I Sign Up?    1. In your internet browser, go to [email or web address]  2. Click on the First Time User? Click Here link in the Sign In box. You will be  redirect to the New Member Sign Up page.   3. Enter your MyChart Access Code exactly as it appears below. You will not need to use this code after you???ve completed the sign-up process. If you do not sign up before the expiration date, you must request a new code.    MyChart Access Code: Activation code not generated  Current MyChart Status: Active (This is the date your MyChart access code will expire)    4. Enter the last four digits of your Social Security Number (xxxx) and Date of Birth (mm/dd/yyyy) as indicated and click Submit. You will be taken to the next sign-up page.  5. Create a MyChart ID. This will be your MyChart login ID and cannot be changed, so think of one that is secure and easy to remember.  6. Create a MyChart password. You can change your password at any time.  7. Enter your Password Reset Question and Answer. This can be used at a later time if you forget your password.   8. Enter your e-mail address. You will receive e-mail notification when new information is available in MyChart.  9. Click Sign Up. You can now view and download portions of your medical record.  10. Click the Download Summary menu link to download a portable copy of your medical information.    Additional Information    If you have questions, please visit the Frequently Asked Questions section of the MyChart website at [email or web address] Remember, MyChart is NOT to be used for urgent needs. For medical emergencies, dial 911.           Follow-up and Dispositions    ?? Return in about 6 weeks (around 02/03/2018), or if symptoms worsen or fail to improve.           I have reviewed with the patient details of the assessment and plan and all questions were answered. Relevent patient education was performed.The most recent lab findings were reviewed with the patient.    An After Visit Summary was printed and given to the patient.

## 2018-02-01 ENCOUNTER — Encounter: Attending: Internal Medicine | Primary: Internal Medicine

## 2018-04-20 ENCOUNTER — Encounter: Attending: Specialist | Primary: Internal Medicine

## 2018-07-03 ENCOUNTER — Encounter: Attending: Internal Medicine | Primary: Internal Medicine

## 2018-11-30 ENCOUNTER — Ambulatory Visit: Attending: Internal Medicine | Primary: Internal Medicine

## 2018-11-30 ENCOUNTER — Ambulatory Visit
Admit: 2018-11-30 | Discharge: 2018-11-30 | Payer: BLUE CROSS/BLUE SHIELD | Attending: Internal Medicine | Primary: Internal Medicine

## 2018-11-30 DIAGNOSIS — Z0000 Encounter for general adult medical examination without abnormal findings: Secondary | ICD-10-CM

## 2018-11-30 LAB — AMB POC HEMOGLOBIN A1C
Hemoglobin A1C, POC: 5.2 %
Hemoglobin A1c (POC): 5.2 %

## 2018-11-30 LAB — AMB POC LIPID PROFILE
Cholesterol (POC): 158
Cholesterol, POC: 158 NA
HDL Cholesterol (POC): 55
HDL Cholesterol, POC: 55 NA
LDL Cholesterol (POC): 91 MG/DL
LDL Cholesterol, POC: 91 MG/DL
Non-HDL Cholesterol: 103
Non-HDL Cholesterol: 103 NA
TChol/HDL Ratio (POC): 2.9
TChol/HDL Ratio (POC): 2.9 NA
Triglycerides (POC): 60
Triglycerides, POC: 60 NA

## 2018-11-30 LAB — AMB POC GLUCOSE BLOOD, BY GLUCOSE MONITORING DEVICE
Glucose POC: 93 mg/dL
Glucose, POC: 93 mg/dL

## 2018-11-30 NOTE — Progress Notes (Signed)
Alejandro Cohen is a 58 y.o. male and presents with Complete Physical  .  Subjective:  He needs a general evaluation    He has offered no new complaints      Review of Systems  Constitutional: negative for fevers, chills, anorexia and weight loss  Eyes:   negative for visual disturbance and irritation  ENT:   negative for tinnitus,sore throat,nasal congestion,ear pains.hoarseness  Respiratory:  negative for cough, hemoptysis, dyspnea,wheezing  CV:   negative for chest pain, palpitations, lower extremity edema  GI:   negative for nausea, vomiting, diarrhea, abdominal pain,melena  Endo:               negative for polyuria,polydipsia,polyphagia,heat intolerance  Genitourinary: negative for frequency, dysuria and hematuria  Integument:  negative for rash and pruritus  Hematologic:  negative for easy bruising and gum/nose bleeding  Musculoskel: negative for myalgias, arthralgias, back pain, muscle weakness, joint pain  Neurological:  negative for headaches, dizziness, vertigo, memory problems and gait   Behavl/Psych: negative for feelings of anxiety, depression, mood changes    Past Medical History:   Diagnosis Date   ??? Contact dermatitis and other eczema, due to unspecified cause     acne     History reviewed. No pertinent surgical history.  Social History     Socioeconomic History   ??? Marital status: MARRIED     Spouse name: Not on file   ??? Number of children: Not on file   ??? Years of education: Not on file   ??? Highest education level: Not on file   Tobacco Use   ??? Smoking status: Former Smoker     Last attempt to quit: 10/09/1987     Years since quitting: 31.1   ??? Smokeless tobacco: Never Used   ??? Tobacco comment: smoked for 3 yrs of  and on   Substance and Sexual Activity   ??? Alcohol use: No   ??? Drug use: No   ??? Sexual activity: Yes     Partners: Female     Birth control/protection: None     Family History   Problem Relation Age of Onset   ??? Diabetes Mother    ??? Elevated Lipids Mother    ??? Migraines Mother    ??? Heart  Disease Mother    ??? Hypertension Mother    ??? Psychiatric Disorder Mother    ??? Stroke Mother    ??? Alcohol abuse Father    ??? Diabetes Father    ??? Elevated Lipids Father    ??? Heart Disease Father    ??? Hypertension Father    ??? Stroke Father    ??? Alcohol abuse Maternal Grandmother    ??? Alcohol abuse Maternal Grandfather    ??? Alcohol abuse Paternal Grandmother    ??? Alcohol abuse Paternal Grandfather      Current Outpatient Medications   Medication Sig Dispense Refill   ??? aspirin delayed-release 81 mg tablet Take  by mouth daily.     ??? multivitamin (ONE A DAY) tablet Take 1 Tab by mouth daily.       No Known Allergies    Objective:  Visit Vitals  BP 118/78   Pulse 72   Temp 98.2 ??F (36.8 ??C) (Oral)   Resp 16   Ht 5\' 9"  (1.753 m)   Wt 239 lb (108.4 kg)   SpO2 97%   BMI 35.29 kg/m??     Physical Exam:   General appearance - alert, well appearing, and  in no distress  Mental status - alert, oriented to person, place, and time  EYE-PERLA, EOMI, corneas normal, no foreign bodies  ENT-ENT exam normal, no neck nodes or sinus tenderness  Nose - normal and patent, no erythema, discharge or polyps  Mouth - mucous membranes moist, pharynx normal without lesions  Neck - supple, no significant adenopathy   Chest - clear to auscultation, no wheezes, rales or rhonchi, symmetric air entry   Heart - normal rate, regular rhythm, normal S1, S2, no murmurs, rubs, clicks or gallops   Abdomen - soft, nontender, nondistended, no masses or organomegaly  Lymph- no adenopathy palpable  Ext-peripheral pulses normal, no pedal edema, no clubbing or cyanosis  Skin-Warm and dry. no hyperpigmentation, vitiligo, or suspicious lesions  Neuro -alert, oriented, normal speech, no focal findings or movement disorder noted  Neck-normal C-spine, no tenderness, full ROM without pain  Feet-no nail deformities or callus formation with good pulses noted      Results for orders placed or performed in visit on 11/30/18   AMB POC LIPID PROFILE   Result Value Ref Range     Cholesterol (POC) 158     Triglycerides (POC) 60     HDL Cholesterol (POC) 55     Non-HDL Cholesterol 103     LDL Cholesterol (POC) 91 MG/DL    TChol/HDL Ratio (POC) 2.9    AMB POC GLUCOSE BLOOD, BY GLUCOSE MONITORING DEVICE   Result Value Ref Range    Glucose POC 93 mg/dL   AMB POC HEMOGLOBIN [id number]   Result Value Ref Range    Hemoglobin A1c (POC) 5.2 %       Assessment/Plan:    ICD-10-CM ICD-9-CM    1. Laboratory tests ordered as part of a complete physical exam (CPE)  Z00.00 V72.62 AMB POC LIPID PROFILE      AMB POC GLUCOSE BLOOD, BY GLUCOSE MONITORING DEVICE      AMB POC HEMOGLOBIN A1C      CBC W/O DIFF      METABOLIC PANEL, COMPREHENSIVE   2. Prostate cancer screening  Z12.5 V76.44 PSA, DIAGNOSTIC (PROSTATE SPECIFIC AG)     Orders Placed This Encounter   ??? CBC W/O DIFF   ??? METABOLIC PANEL, COMPREHENSIVE   ??? PROSTATE SPECIFIC AG   ??? AMB POC LIPID PROFILE   ??? AMB POC GLUCOSE BLOOD, BY GLUCOSE MONITORING DEVICE   ??? AMB POC HEMOGLOBIN A1C     call if any problems,Take 81mg  aspirin daily  Patient Instructions   MyChart Activation    Thank you for requesting access to MyChart. Please follow the instructions below to securely access and download your online medical record. MyChart allows you to send messages to your doctor, view your test results, renew your prescriptions, schedule appointments, and more.    How Do I Sign Up?    1. In your internet browser, go to [email or web address]  2. Click on the First Time User? Click Here link in the Sign In box. You will be redirect to the New Member Sign Up page.  3. Enter your MyChart Access Code exactly as it appears below. You will not need to use this code after you???ve completed the sign-up process. If you do not sign up before the expiration date, you must request a new code.    MyChart Access Code: Activation code not generated  Current MyChart Status: Active (This is the date your MyChart access code will expire)    4. Enter the last four digits of  your Social Security  Number (xxxx) and Date of Birth (mm/dd/yyyy) as indicated and click Submit. You will be taken to the next sign-up page.  5. Create a MyChart ID. This will be your MyChart login ID and cannot be changed, so think of one that is secure and easy to remember.  6. Create a MyChart password. You can change your password at any time.  7. Enter your Password Reset Question and Answer. This can be used at a later time if you forget your password.   8. Enter your e-mail address. You will receive e-mail notification when new information is available in [facility].  9. Click Sign Up. You can now view and download portions of your medical record.  10. Click the Download Summary menu link to download a portable copy of your medical information.    Additional Information    If you have questions, please visit the Frequently Asked Questions section of the MyChart website at [email or web address] Remember, MyChart is NOT to be used for urgent needs. For medical emergencies, dial 911.           Follow-up and Dispositions    ?? Return in about 3 months (around 03/02/2019), or if symptoms worsen or fail to improve.           I have reviewed with the patient details of the assessment and plan and all questions were answered. Relevent patient education was performed.The most recent lab findings were reviewed with the patient.    An After Visit Summary was printed and given to the patient.

## 2018-11-30 NOTE — Patient Instructions (Signed)
MyChart Activation    Thank you for requesting access to MyChart. Please follow the instructions below to securely access and download your online medical record. MyChart allows you to send messages to your doctor, view your test results, renew your prescriptions, schedule appointments, and more.    How Do I Sign Up?    1. In your internet browser, go to [email or web address]  2. Click on the First Time User? Click Here link in the Sign In box. You will be redirect to the New Member Sign Up page.  3. Enter your MyChart Access Code exactly as it appears below. You will not need to use this code after you???ve completed the sign-up process. If you do not sign up before the expiration date, you must request a new code.    MyChart Access Code: Activation code not generated  Current MyChart Status: Active (This is the date your MyChart access code will expire)    4. Enter the last four digits of your Social Security Number (xxxx) and Date of Birth (mm/dd/yyyy) as indicated and click Submit. You will be taken to the next sign-up page.  5. Create a MyChart ID. This will be your MyChart login ID and cannot be changed, so think of one that is secure and easy to remember.  6. Create a MyChart password. You can change your password at any time.  7. Enter your Password Reset Question and Answer. This can be used at a later time if you forget your password.   8. Enter your e-mail address. You will receive e-mail notification when new information is available in MyChart.  9. Click Sign Up. You can now view and download portions of your medical record.  10. Click the Download Summary menu link to download a portable copy of your medical information.    Additional Information    If you have questions, please visit the Frequently Asked Questions section of the MyChart website at [email or web address] Remember, MyChart is NOT to be used for urgent needs. For medical emergencies, dial 911.

## 2018-11-30 NOTE — Progress Notes (Signed)
Alejandro Cohen is a 58 y.o. male and presents with Complete Physical  .  Subjective:  He needs a general evaluation    He has offered no new complaints      Review of Systems  Constitutional: negative for fevers, chills, anorexia and weight loss  Eyes:   negative for visual disturbance and irritation  ENT:   negative for tinnitus,sore throat,nasal congestion,ear pains.hoarseness  Respiratory:  negative for cough, hemoptysis, dyspnea,wheezing  CV:   negative for chest pain, palpitations, lower extremity edema  GI:   negative for nausea, vomiting, diarrhea, abdominal pain,melena  Endo:               negative for polyuria,polydipsia,polyphagia,heat intolerance  Genitourinary: negative for frequency, dysuria and hematuria  Integument:  negative for rash and pruritus  Hematologic:  negative for easy bruising and gum/nose bleeding  Musculoskel: negative for myalgias, arthralgias, back pain, muscle weakness, joint pain  Neurological:  negative for headaches, dizziness, vertigo, memory problems and gait   Behavl/Psych: negative for feelings of anxiety, depression, mood changes    Past Medical History:   Diagnosis Date   ??? Contact dermatitis and other eczema, due to unspecified cause     acne     History reviewed. No pertinent surgical history.  Social History     Socioeconomic History   ??? Marital status: MARRIED     Spouse name: Not on file   ??? Number of children: Not on file   ??? Years of education: Not on file   ??? Highest education level: Not on file   Tobacco Use   ??? Smoking status: Former Smoker     Last attempt to quit: 10/09/1987     Years since quitting: 31.1   ??? Smokeless tobacco: Never Used   ??? Tobacco comment: smoked for 3 yrs of  and on   Substance and Sexual Activity   ??? Alcohol use: No   ??? Drug use: No   ??? Sexual activity: Yes     Partners: Female     Birth control/protection: None     Family History   Problem Relation Age of Onset   ??? Diabetes Mother    ??? Elevated Lipids Mother    ??? Migraines Mother     ??? Heart Disease Mother    ??? Hypertension Mother    ??? Psychiatric Disorder Mother    ??? Stroke Mother    ??? Alcohol abuse Father    ??? Diabetes Father    ??? Elevated Lipids Father    ??? Heart Disease Father    ??? Hypertension Father    ??? Stroke Father    ??? Alcohol abuse Maternal Grandmother    ??? Alcohol abuse Maternal Grandfather    ??? Alcohol abuse Paternal Grandmother    ??? Alcohol abuse Paternal Grandfather      Current Outpatient Medications   Medication Sig Dispense Refill   ??? aspirin delayed-release 81 mg tablet Take  by mouth daily.     ??? multivitamin (ONE A DAY) tablet Take 1 Tab by mouth daily.       No Known Allergies    Objective:  Visit Vitals  BP 118/78   Pulse 72   Temp 98.2 ??F (36.8 ??C) (Oral)   Resp 16   Ht 5\' 9"  (1.753 m)   Wt 239 lb (108.4 kg)   SpO2 97%   BMI 35.29 kg/m??     Physical Exam:   General appearance - alert, well appearing, and  in no distress  Mental status - alert, oriented to person, place, and time  EYE-PERLA, EOMI, corneas normal, no foreign bodies  ENT-ENT exam normal, no neck nodes or sinus tenderness  Nose - normal and patent, no erythema, discharge or polyps  Mouth - mucous membranes moist, pharynx normal without lesions  Neck - supple, no significant adenopathy   Chest - clear to auscultation, no wheezes, rales or rhonchi, symmetric air entry   Heart - normal rate, regular rhythm, normal S1, S2, no murmurs, rubs, clicks or gallops   Abdomen - soft, nontender, nondistended, no masses or organomegaly  Lymph- no adenopathy palpable  Ext-peripheral pulses normal, no pedal edema, no clubbing or cyanosis  Skin-Warm and dry. no hyperpigmentation, vitiligo, or suspicious lesions  Neuro -alert, oriented, normal speech, no focal findings or movement disorder noted  Neck-normal C-spine, no tenderness, full ROM without pain  Feet-no nail deformities or callus formation with good pulses noted      Results for orders placed or performed in visit on 11/30/18   AMB POC LIPID PROFILE    Result Value Ref Range    Cholesterol (POC) 158     Triglycerides (POC) 60     HDL Cholesterol (POC) 55     Non-HDL Cholesterol 103     LDL Cholesterol (POC) 91 MG/DL    TChol/HDL Ratio (POC) 2.9    AMB POC GLUCOSE BLOOD, BY GLUCOSE MONITORING DEVICE   Result Value Ref Range    Glucose POC 93 mg/dL   AMB POC HEMOGLOBIN A1C   Result Value Ref Range    Hemoglobin A1c (POC) 5.2 %       Assessment/Plan:    ICD-10-CM ICD-9-CM    1. Laboratory tests ordered as part of a complete physical exam (CPE)  Z00.00 V72.62 AMB POC LIPID PROFILE      AMB POC GLUCOSE BLOOD, BY GLUCOSE MONITORING DEVICE      AMB POC HEMOGLOBIN A1C      CBC W/O DIFF      METABOLIC PANEL, COMPREHENSIVE   2. Prostate cancer screening  Z12.5 V76.44 PSA, DIAGNOSTIC (PROSTATE SPECIFIC AG)     Orders Placed This Encounter   ??? CBC W/O DIFF   ??? METABOLIC PANEL, COMPREHENSIVE   ??? PROSTATE SPECIFIC AG   ??? AMB POC LIPID PROFILE   ??? AMB POC GLUCOSE BLOOD, BY GLUCOSE MONITORING DEVICE   ??? AMB POC HEMOGLOBIN A1C     call if any problems,Take 81mg  aspirin daily  Patient Instructions   MyChart Activation    Thank you for requesting access to MyChart. Please follow the instructions below to securely access and download your online medical record. MyChart allows you to send messages to your doctor, view your test results, renew your prescriptions, schedule appointments, and more.    How Do I Sign Up?    1. In your internet browser, go to [email or web address]  2. Click on the First Time User? Click Here link in the Sign In box. You will be redirect to the New Member Sign Up page.  3. Enter your MyChart Access Code exactly as it appears below. You will not need to use this code after you???ve completed the sign-up process. If you do not sign up before the expiration date, you must request a new code.    MyChart Access Code: Activation code not generated  Current MyChart Status: Active (This is the date your MyChart access code will expire)     4. Enter the last four digits  of your Social Security Number (xxxx) and Date of Birth (mm/dd/yyyy) as indicated and click Submit. You will be taken to the next sign-up page.  5. Create a MyChart ID. This will be your MyChart login ID and cannot be changed, so think of one that is secure and easy to remember.  6. Create a MyChart password. You can change your password at any time.  7. Enter your Password Reset Question and Answer. This can be used at a later time if you forget your password.   8. Enter your e-mail address. You will receive e-mail notification when new information is available in MyChart.  9. Click Sign Up. You can now view and download portions of your medical record.  10. Click the Download Summary menu link to download a portable copy of your medical information.    Additional Information    If you have questions, please visit the Frequently Asked Questions section of the MyChart website at [email or web address] Remember, MyChart is NOT to be used for urgent needs. For medical emergencies, dial 911.           Follow-up and Dispositions    ?? Return in about 3 months (around 03/02/2019), or if symptoms worsen or fail to improve.           I have reviewed with the patient details of the assessment and plan and all questions were answered. Relevent patient education was performed.The most recent lab findings were reviewed with the patient.    An After Visit Summary was printed and given to the patient.

## 2019-03-02 ENCOUNTER — Encounter: Attending: Internal Medicine | Primary: Internal Medicine

## 2019-03-07 ENCOUNTER — Ambulatory Visit: Payer: BLUE CROSS/BLUE SHIELD | Attending: Internal Medicine | Primary: Internal Medicine

## 2019-03-07 NOTE — Telephone Encounter (Signed)
Alejandro Cohen appointment was rescheduled from today to tomorrow at 2:30pm virtual visit. Patient is aware.

## 2019-03-08 ENCOUNTER — Telehealth: Attending: Internal Medicine | Primary: Internal Medicine

## 2019-03-08 ENCOUNTER — Telehealth
Admit: 2019-03-08 | Discharge: 2019-03-08 | Payer: BLUE CROSS/BLUE SHIELD | Attending: Internal Medicine | Primary: Internal Medicine

## 2019-03-08 DIAGNOSIS — M940 Chondrocostal junction syndrome [Tietze]: Secondary | ICD-10-CM

## 2019-03-08 NOTE — Progress Notes (Signed)
Alejandro Cohen is a 58 y.o. male and presents with Shortness of Breath and ED Follow-up  .  Subjective:    States he has  Bouts of pain in the rib cage and was seen at [facility] treated and released.  He was placed on Motrin with some relief    Review of Systems  Constitutional: negative for fevers, chills, anorexia and weight loss  Eyes:   negative for visual disturbance and irritation  ENT:   negative for tinnitus,sore throat,nasal congestion,ear pains.hoarseness  Respiratory:  negative for cough, hemoptysis, dyspnea,wheezing  CV:   negative for chest pain, palpitations, lower extremity edema  GI:   negative for nausea, vomiting, diarrhea, abdominal pain,melena  Endo:               negative for polyuria,polydipsia,polyphagia,heat intolerance  Genitourinary: negative for frequency, dysuria and hematuria  Integument:  negative for rash and pruritus  Hematologic:  negative for easy bruising and gum/nose bleeding  Musculoskel: myalgias, arthralgias,  joint pain  Neurological:  negative for headaches, dizziness, vertigo, memory problems and gait   Behavl/Psych: negative for feelings of anxiety, depression, mood changes    Past Medical History:   Diagnosis Date   ??? Contact dermatitis and other eczema, due to unspecified cause     acne     No past surgical history on file.  Social History     Socioeconomic History   ??? Marital status: MARRIED     Spouse name: Not on file   ??? Number of children: Not on file   ??? Years of education: Not on file   ??? Highest education level: Not on file   Tobacco Use   ??? Smoking status: Former Smoker     Last attempt to quit: 10/09/1987     Years since quitting: 31.4   ??? Smokeless tobacco: Never Used   ??? Tobacco comment: smoked for 3 yrs of  and on   Substance and Sexual Activity   ??? Alcohol use: No   ??? Drug use: No   ??? Sexual activity: Yes     Partners: Female     Birth control/protection: None     Family History   Problem Relation Age of Onset   ??? Diabetes Mother    ??? Elevated Lipids  Mother    ??? Migraines Mother    ??? Heart Disease Mother    ??? Hypertension Mother    ??? Psychiatric Disorder Mother    ??? Stroke Mother    ??? Alcohol abuse Father    ??? Diabetes Father    ??? Elevated Lipids Father    ??? Heart Disease Father    ??? Hypertension Father    ??? Stroke Father    ??? Alcohol abuse Maternal Grandmother    ??? Alcohol abuse Maternal Grandfather    ??? Alcohol abuse Paternal Grandmother    ??? Alcohol abuse Paternal Grandfather      Current Outpatient Medications   Medication Sig Dispense Refill   ??? ibuprofen (MOTRIN) 800 mg tablet      ??? cyclobenzaprine (FLEXERIL) 10 mg tablet      ??? aspirin delayed-release 81 mg tablet Take  by mouth daily.     ??? multivitamin (ONE A DAY) tablet Take 1 Tab by mouth daily.       No Known Allergies    Objective:  There were no vitals taken for this visit.  Physical Exam:   General appearance - alert, well appearing, and in no distress  Mental status - alert, oriented to person, place, and time  EYE-PERLA, EOMI, corneas normal, no foreign bodies  ENT-ENT exam normal, no neck nodes or sinus tenderness  Nose - normal and patent, no erythema, discharge or polyps  Mouth - mucous membranes moist, pharynx normal without lesions  Neck - supple, no significant adenopathy   Chest - clear to auscultation, no wheezes, rales or rhonchi, symmetric air entry   Heart - normal rate, regular rhythm, normal S1, S2, no murmurs, rubs, clicks or gallops   Abdomen - soft, nontender, nondistended, no masses or organomegaly  Lymph- no adenopathy palpable  Ext-peripheral pulses normal, no pedal edema, no clubbing or cyanosis  Skin-Warm and dry. no hyperpigmentation, vitiligo, or suspicious lesions  Neuro -alert, oriented, normal speech, no focal findings or movement disorder noted  Neck-normal C-spine, no tenderness, full ROM without pain  Feet-no nail deformities or callus formation with good pulses noted      Results for orders placed or performed in visit on 11/30/18   AMB POC LIPID PROFILE   Result  Value Ref Range    Cholesterol (POC) 158     Triglycerides (POC) 60     HDL Cholesterol (POC) 55     Non-HDL Cholesterol 103     LDL Cholesterol (POC) 91 MG/DL    TChol/HDL Ratio (POC) 2.9    AMB POC GLUCOSE BLOOD, BY GLUCOSE MONITORING DEVICE   Result Value Ref Range    Glucose POC 93 mg/dL   AMB POC HEMOGLOBIN X4J   Result Value Ref Range    Hemoglobin A1c (POC) 5.2 %       Assessment/Plan:    ICD-10-CM ICD-9-CM    1. Costochondritis, acute  M94.0 733.6      Orders Placed This Encounter   ??? ibuprofen (MOTRIN) 800 mg tablet   ??? cyclobenzaprine (FLEXERIL) 10 mg tablet     call if any problems,Take 81mg  aspirin daily  There are no Patient Instructions on file for this visit.   Follow-up and Dispositions    ?? Return in about 4 weeks (around 04/05/2019), or if symptoms worsen or fail to improve.           I have reviewed with the patient details of the assessment and plan and all questions were answered. Relevent patient education was performed.The most recent lab findings were reviewed with the patient.    An After Visit Summary was printed and given to the patient.

## 2019-03-08 NOTE — Progress Notes (Signed)
1. Have you been to the ER, urgent care clinic since your last visit?  Hospitalized since your last visit?YES/Urgent Care/SOB/Backpain    2. Have you seen or consulted any other health care providers outside of the [facility] since your last visit?  Include any pap smears or colon screening. No    3 most recent PHQ Screens 11/30/2018   PHQ Not Done Medical Reason (indicate in comments)   Little interest or pleasure in doing things Not at all   Feeling down, depressed, irritable, or hopeless Not at all   Total Score PHQ 2 0     Chief Complaint   Patient presents with   . Shortness of Breath   . ED Follow-up

## 2019-03-08 NOTE — Progress Notes (Addendum)
1. Have you been to the ER, urgent care clinic since your last visit?  Hospitalized since your last visit?YES/Urgent Care/SOB/Backpain    2. Have you seen or consulted any other health care providers outside of the [facility] since your last visit?  Include any pap smears or colon screening. No    3 most recent PHQ Screens 11/30/2018   PHQ Not Done Medical Reason (indicate in comments)   Little interest or pleasure in doing things Not at all   Feeling down, depressed, irritable, or hopeless Not at all   Total Score PHQ 2 0     Chief Complaint   Patient presents with   ??? Shortness of Breath   ??? ED Follow-up

## 2019-03-08 NOTE — Progress Notes (Signed)
Alejandro Cohen is a 58 y.o. male and presents with Shortness of Breath and ED Follow-up  .  Subjective:    States he has  Bouts of pain in the rib cage and was seen at [facility] treated and released.  He was placed on Motrin with some relief    Review of Systems  Constitutional: negative for fevers, chills, anorexia and weight loss  Eyes:   negative for visual disturbance and irritation  ENT:   negative for tinnitus,sore throat,nasal congestion,ear pains.hoarseness  Respiratory:  negative for cough, hemoptysis, dyspnea,wheezing  CV:   negative for chest pain, palpitations, lower extremity edema  GI:   negative for nausea, vomiting, diarrhea, abdominal pain,melena  Endo:               negative for polyuria,polydipsia,polyphagia,heat intolerance  Genitourinary: negative for frequency, dysuria and hematuria  Integument:  negative for rash and pruritus  Hematologic:  negative for easy bruising and gum/nose bleeding  Musculoskel: myalgias, arthralgias,  joint pain  Neurological:  negative for headaches, dizziness, vertigo, memory problems and gait   Behavl/Psych: negative for feelings of anxiety, depression, mood changes    Past Medical History:   Diagnosis Date   ??? Contact dermatitis and other eczema, due to unspecified cause     acne     No past surgical history on file.  Social History     Socioeconomic History   ??? Marital status: MARRIED     Spouse name: Not on file   ??? Number of children: Not on file   ??? Years of education: Not on file   ??? Highest education level: Not on file   Tobacco Use   ??? Smoking status: Former Smoker     Last attempt to quit: 10/09/1987     Years since quitting: 31.4   ??? Smokeless tobacco: Never Used   ??? Tobacco comment: smoked for 3 yrs of  and on   Substance and Sexual Activity   ??? Alcohol use: No   ??? Drug use: No   ??? Sexual activity: Yes     Partners: Female     Birth control/protection: None     Family History   Problem Relation Age of Onset   ??? Diabetes Mother     ??? Elevated Lipids Mother    ??? Migraines Mother    ??? Heart Disease Mother    ??? Hypertension Mother    ??? Psychiatric Disorder Mother    ??? Stroke Mother    ??? Alcohol abuse Father    ??? Diabetes Father    ??? Elevated Lipids Father    ??? Heart Disease Father    ??? Hypertension Father    ??? Stroke Father    ??? Alcohol abuse Maternal Grandmother    ??? Alcohol abuse Maternal Grandfather    ??? Alcohol abuse Paternal Grandmother    ??? Alcohol abuse Paternal Grandfather      Current Outpatient Medications   Medication Sig Dispense Refill   ??? ibuprofen (MOTRIN) 800 mg tablet      ??? cyclobenzaprine (FLEXERIL) 10 mg tablet      ??? aspirin delayed-release 81 mg tablet Take  by mouth daily.     ??? multivitamin (ONE A DAY) tablet Take 1 Tab by mouth daily.       No Known Allergies    Objective:  There were no vitals taken for this visit.  Physical Exam:   General appearance - alert, well appearing, and in no distress  Mental status - alert, oriented to person, place, and time  EYE-PERLA, EOMI, corneas normal, no foreign bodies  ENT-ENT exam normal, no neck nodes or sinus tenderness  Nose - normal and patent, no erythema, discharge or polyps  Mouth - mucous membranes moist, pharynx normal without lesions  Neck - supple, no significant adenopathy   Chest - clear to auscultation, no wheezes, rales or rhonchi, symmetric air entry   Heart - normal rate, regular rhythm, normal S1, S2, no murmurs, rubs, clicks or gallops   Abdomen - soft, nontender, nondistended, no masses or organomegaly  Lymph- no adenopathy palpable  Ext-peripheral pulses normal, no pedal edema, no clubbing or cyanosis  Skin-Warm and dry. no hyperpigmentation, vitiligo, or suspicious lesions  Neuro -alert, oriented, normal speech, no focal findings or movement disorder noted  Neck-normal C-spine, no tenderness, full ROM without pain  Feet-no nail deformities or callus formation with good pulses noted      Results for orders placed or performed in visit on 11/30/18    AMB POC LIPID PROFILE   Result Value Ref Range    Cholesterol (POC) 158     Triglycerides (POC) 60     HDL Cholesterol (POC) 55     Non-HDL Cholesterol 103     LDL Cholesterol (POC) 91 MG/DL    TChol/HDL Ratio (POC) 2.9    AMB POC GLUCOSE BLOOD, BY GLUCOSE MONITORING DEVICE   Result Value Ref Range    Glucose POC 93 mg/dL   AMB POC HEMOGLOBIN A1C   Result Value Ref Range    Hemoglobin A1c (POC) 5.2 %       Assessment/Plan:    ICD-10-CM ICD-9-CM    1. Costochondritis, acute  M94.0 733.6      Orders Placed This Encounter   ??? ibuprofen (MOTRIN) 800 mg tablet   ??? cyclobenzaprine (FLEXERIL) 10 mg tablet     call if any problems,Take 81mg  aspirin daily  There are no Patient Instructions on file for this visit.   Follow-up and Dispositions    ?? Return in about 4 weeks (around 04/05/2019), or if symptoms worsen or fail to improve.           I have reviewed with the patient details of the assessment and plan and all questions were answered. Relevent patient education was performed.The most recent lab findings were reviewed with the patient.    An After Visit Summary was printed and given to the patient.

## 2019-03-09 ENCOUNTER — Ambulatory Visit: Primary: Internal Medicine

## 2019-03-09 ENCOUNTER — Ambulatory Visit: Admit: 2019-03-09 | Payer: BLUE CROSS/BLUE SHIELD | Primary: Internal Medicine

## 2019-03-09 DIAGNOSIS — Z20828 Contact with and (suspected) exposure to other viral communicable diseases: Secondary | ICD-10-CM

## 2019-03-09 DIAGNOSIS — Z20822 Contact with and (suspected) exposure to covid-19: Secondary | ICD-10-CM

## 2019-03-09 NOTE — Progress Notes (Signed)
This patient was seen in Flu Clinic at [facility] while in their vehicle due to COVID-19 pandemic with PPE and focused examination in order to decrease community viral transmission.     The patient/guardian gave verbal consent to treat.    The history is provided by the patient.   Cough   The history is provided by the patient. This is a new problem. The current episode started more than 2 days ago. The problem occurs every few minutes. The problem has not changed since onset.The cough is non-productive. There has been no fever. Associated symptoms include chest pain and shortness of breath. Pertinent negatives include no chills, no rhinorrhea, no wheezing and no nausea. He has tried antibiotics for the symptoms. The treatment provided moderate relief. Risk factors: no covid exposure. His past medical history is significant for pneumonia (diagnosed 3 days before).        Past Medical History:   Diagnosis Date   ??? Contact dermatitis and other eczema, due to unspecified cause     acne        No past surgical history on file.      Family History   Problem Relation Age of Onset   ??? Diabetes Mother    ??? Elevated Lipids Mother    ??? Migraines Mother    ??? Heart Disease Mother    ??? Hypertension Mother    ??? Psychiatric Disorder Mother    ??? Stroke Mother    ??? Alcohol abuse Father    ??? Diabetes Father    ??? Elevated Lipids Father    ??? Heart Disease Father    ??? Hypertension Father    ??? Stroke Father    ??? Alcohol abuse Maternal Grandmother    ??? Alcohol abuse Maternal Grandfather    ??? Alcohol abuse Paternal Grandmother    ??? Alcohol abuse Paternal Grandfather         Social History     Socioeconomic History   ??? Marital status: MARRIED     Spouse name: Not on file   ??? Number of children: Not on file   ??? Years of education: Not on file   ??? Highest education level: Not on file   Occupational History   ??? Not on file   Social Needs   ??? Financial resource strain: Not on file   ??? Food insecurity     Worry: Not on file      Inability: Not on file   ??? Transportation needs     Medical: Not on file     Non-medical: Not on file   Tobacco Use   ??? Smoking status: Former Smoker     Last attempt to quit: 10/09/1987     Years since quitting: 31.4   ??? Smokeless tobacco: Never Used   ??? Tobacco comment: smoked for 3 yrs of  and on   Substance and Sexual Activity   ??? Alcohol use: No   ??? Drug use: No   ??? Sexual activity: Yes     Partners: Female     Birth control/protection: None   Lifestyle   ??? Physical activity     Days per week: Not on file     Minutes per session: Not on file   ??? Stress: Not on file   Relationships   ??? Social Wellsite geologist on phone: Not on file     Gets together: Not on file     Attends  religious service: Not on file     Active member of club or organization: Not on file     Attends meetings of clubs or organizations: Not on file     Relationship status: Not on file   ??? Intimate partner violence     Fear of current or ex partner: Not on file     Emotionally abused: Not on file     Physically abused: Not on file     Forced sexual activity: Not on file   Other Topics Concern   ??? Not on file   Social History Narrative   ??? Not on file                ALLERGIES: Patient has no known allergies.    Review of Systems   Constitutional: Negative for chills.   HENT: Negative for rhinorrhea.    Respiratory: Positive for cough and shortness of breath. Negative for wheezing.    Cardiovascular: Positive for chest pain.   Gastrointestinal: Negative for nausea.   All other systems reviewed and are negative.      Vitals:    03/09/19 1521   Pulse: 66   Resp: 18   Temp: 97.8 ??F (36.6 ??C)   SpO2: 97%       Physical Exam  Vitals signs and nursing note reviewed.   Constitutional:       General: He is not in acute distress.     Appearance: He is not ill-appearing.   Pulmonary:      Effort: Pulmonary effort is normal. No respiratory distress.      Breath sounds: Normal breath sounds.         MDM    Procedures        ICD-10-CM ICD-9-CM    1.  Exposure to COVID-19 virus  Z20.828 V01.79 NOVEL CORONAVIRUS (COVID-19)        Tested for Covid-19 and advised to quarantine until result comes back.    No orders of the defined types were placed in this encounter.    No results found for any visits on 03/09/19.  The patients condition was discussed with the patient and they understand.  The patient is to follow up with primary care doctor.  If signs and symptoms become worse the pt is to go to the ER. The patient is to take medications as prescribed.

## 2019-03-09 NOTE — Progress Notes (Signed)
This patient was seen in Flu Clinic at [facility] while in their vehicle due to COVID-19 pandemic with PPE and focused examination in order to decrease community viral transmission.     The patient/guardian gave verbal consent to treat.    The history is provided by the patient.   Cough   The history is provided by the patient. This is a new problem. The current episode started more than 2 days ago. The problem occurs every few minutes. The problem has not changed since onset.The cough is non-productive. There has been no fever. Associated symptoms include chest pain and shortness of breath. Pertinent negatives include no chills, no rhinorrhea, no wheezing and no nausea. He has tried antibiotics for the symptoms. The treatment provided moderate relief. Risk factors: no covid exposure. His past medical history is significant for pneumonia (diagnosed 3 days before).        Past Medical History:   Diagnosis Date   ??? Contact dermatitis and other eczema, due to unspecified cause     acne        No past surgical history on file.      Family History   Problem Relation Age of Onset   ??? Diabetes Mother    ??? Elevated Lipids Mother    ??? Migraines Mother    ??? Heart Disease Mother    ??? Hypertension Mother    ??? Psychiatric Disorder Mother    ??? Stroke Mother    ??? Alcohol abuse Father    ??? Diabetes Father    ??? Elevated Lipids Father    ??? Heart Disease Father    ??? Hypertension Father    ??? Stroke Father    ??? Alcohol abuse Maternal Grandmother    ??? Alcohol abuse Maternal Grandfather    ??? Alcohol abuse Paternal Grandmother    ??? Alcohol abuse Paternal Grandfather         Social History     Socioeconomic History   ??? Marital status: MARRIED     Spouse name: Not on file   ??? Number of children: Not on file   ??? Years of education: Not on file   ??? Highest education level: Not on file   Occupational History   ??? Not on file   Social Needs   ??? Financial resource strain: Not on file   ??? Food insecurity      Worry: Not on file     Inability: Not on file   ??? Transportation needs     Medical: Not on file     Non-medical: Not on file   Tobacco Use   ??? Smoking status: Former Smoker     Last attempt to quit: 10/09/1987     Years since quitting: 31.4   ??? Smokeless tobacco: Never Used   ??? Tobacco comment: smoked for 3 yrs of  and on   Substance and Sexual Activity   ??? Alcohol use: No   ??? Drug use: No   ??? Sexual activity: Yes     Partners: Female     Birth control/protection: None   Lifestyle   ??? Physical activity     Days per week: Not on file     Minutes per session: Not on file   ??? Stress: Not on file   Relationships   ??? Social Wellsite geologist on phone: Not on file     Gets together: Not on file     Attends  religious service: Not on file     Active member of club or organization: Not on file     Attends meetings of clubs or organizations: Not on file     Relationship status: Not on file   ??? Intimate partner violence     Fear of current or ex partner: Not on file     Emotionally abused: Not on file     Physically abused: Not on file     Forced sexual activity: Not on file   Other Topics Concern   ??? Not on file   Social History Narrative   ??? Not on file                ALLERGIES: Patient has no known allergies.    Review of Systems   Constitutional: Negative for chills.   HENT: Negative for rhinorrhea.    Respiratory: Positive for cough and shortness of breath. Negative for wheezing.    Cardiovascular: Positive for chest pain.   Gastrointestinal: Negative for nausea.   All other systems reviewed and are negative.      Vitals:    03/09/19 1521   Pulse: 66   Resp: 18   Temp: 97.8 ??F (36.6 ??C)   SpO2: 97%       Physical Exam  Vitals signs and nursing note reviewed.   Constitutional:       General: He is not in acute distress.     Appearance: He is not ill-appearing.   Pulmonary:      Effort: Pulmonary effort is normal. No respiratory distress.      Breath sounds: Normal breath sounds.         MDM    Procedures         ICD-10-CM ICD-9-CM    1. Exposure to COVID-19 virus  Z20.828 V01.79 NOVEL CORONAVIRUS (COVID-19)        Tested for Covid-19 and advised to quarantine until result comes back.    No orders of the defined types were placed in this encounter.    No results found for any visits on 03/09/19.  The patients condition was discussed with the patient and they understand.  The patient is to follow up with primary care doctor.  If signs and symptoms become worse the pt is to go to the ER. The patient is to take medications as prescribed.

## 2019-03-12 LAB — NOVEL CORONAVIRUS (COVID-19): SARS-CoV-2, NAA: NOT DETECTED

## 2019-03-12 LAB — COVID-19: SARS-CoV-2, NAA: NOT DETECTED

## 2019-05-16 ENCOUNTER — Ambulatory Visit: Primary: Internal Medicine

## 2019-05-16 ENCOUNTER — Ambulatory Visit: Admit: 2019-05-16 | Payer: BLUE CROSS/BLUE SHIELD | Primary: Internal Medicine

## 2019-05-16 DIAGNOSIS — Z20822 Contact with and (suspected) exposure to covid-19: Secondary | ICD-10-CM

## 2019-05-16 NOTE — Progress Notes (Signed)
This patient was seen at [facility] while in their vehicle due to COVID-19 pandemic with PPE and focused examination in order to decrease community viral transmission.     The patient/guardian gave verbal consent to treat.    The history is provided by the patient.      Asymptomatic  His son was expose to covid   from school and he is been quarantine and family asked to be tested      Past Medical History:   Diagnosis Date   ??? Contact dermatitis and other eczema, due to unspecified cause     acne        History reviewed. No pertinent surgical history.      Family History   Problem Relation Age of Onset   ??? Diabetes Mother    ??? Elevated Lipids Mother    ??? Migraines Mother    ??? Heart Disease Mother    ??? Hypertension Mother    ??? Psychiatric Disorder Mother    ??? Stroke Mother    ??? Alcohol abuse Father    ??? Diabetes Father    ??? Elevated Lipids Father    ??? Heart Disease Father    ??? Hypertension Father    ??? Stroke Father    ??? Alcohol abuse Maternal Grandmother    ??? Alcohol abuse Maternal Grandfather    ??? Alcohol abuse Paternal Grandmother    ??? Alcohol abuse Paternal Grandfather         Social History     Socioeconomic History   ??? Marital status: MARRIED     Spouse name: Not on file   ??? Number of children: Not on file   ??? Years of education: Not on file   ??? Highest education level: Not on file   Occupational History   ??? Not on file   Social Needs   ??? Financial resource strain: Not on file   ??? Food insecurity     Worry: Not on file     Inability: Not on file   ??? Transportation needs     Medical: Not on file     Non-medical: Not on file   Tobacco Use   ??? Smoking status: Former Smoker     Quit date: 10/09/1987     Years since quitting: 31.6   ??? Smokeless tobacco: Never Used   ??? Tobacco comment: smoked for 3 yrs of  and on   Substance and Sexual Activity   ??? Alcohol use: No   ??? Drug use: No   ??? Sexual activity: Yes     Partners: Female     Birth control/protection: None   Lifestyle   ??? Physical activity      Days per week: Not on file     Minutes per session: Not on file   ??? Stress: Not on file   Relationships   ??? Social Product manager on phone: Not on file     Gets together: Not on file     Attends religious service: Not on file     Active member of club or organization: Not on file     Attends meetings of clubs or organizations: Not on file     Relationship status: Not on file   ??? Intimate partner violence     Fear of current or ex partner: Not on file     Emotionally abused: Not on file     Physically abused: Not on  file     Forced sexual activity: Not on file   Other Topics Concern   ??? Not on file   Social History Narrative   ??? Not on file                ALLERGIES: Patient has no known allergies.    Review of Systems   All other systems reviewed and are negative.      Vitals:    05/16/19 1913   Pulse: 68   Resp: 16   Temp: 98.3 ??F (36.8 ??C)   SpO2: 95%       Physical Exam  Vitals signs and nursing note reviewed.   Constitutional:       General: He is not in acute distress.     Appearance: He is not ill-appearing.   Pulmonary:      Effort: Pulmonary effort is normal. No respiratory distress.      Breath sounds: Normal breath sounds.         MDM    Procedures      ICD-10-CM ICD-9-CM    1. Exposure to COVID-19 virus  Z20.822 V01.79 NOVEL CORONAVIRUS (COVID-19)     No orders of the defined types were placed in this encounter.    No results found for any visits on 05/16/19.  The patients condition was discussed with the patient and they understand.  The patient is to follow up with primary care doctor.  If signs and symptoms become worse the pt is to go to the ER. The patient is to take medications as prescribed.

## 2019-05-16 NOTE — Progress Notes (Addendum)
This patient was seen at [facility] while in their vehicle due to COVID-19 pandemic with PPE and focused examination in order to decrease community viral transmission.     The patient/guardian gave verbal consent to treat.    The history is provided by the patient.      Asymptomatic  His son was expose to covid   from school and he is been quarantine and family asked to be tested      Past Medical History:   Diagnosis Date   ??? Contact dermatitis and other eczema, due to unspecified cause     acne        History reviewed. No pertinent surgical history.      Family History   Problem Relation Age of Onset   ??? Diabetes Mother    ??? Elevated Lipids Mother    ??? Migraines Mother    ??? Heart Disease Mother    ??? Hypertension Mother    ??? Psychiatric Disorder Mother    ??? Stroke Mother    ??? Alcohol abuse Father    ??? Diabetes Father    ??? Elevated Lipids Father    ??? Heart Disease Father    ??? Hypertension Father    ??? Stroke Father    ??? Alcohol abuse Maternal Grandmother    ??? Alcohol abuse Maternal Grandfather    ??? Alcohol abuse Paternal Grandmother    ??? Alcohol abuse Paternal Grandfather         Social History     Socioeconomic History   ??? Marital status: MARRIED     Spouse name: Not on file   ??? Number of children: Not on file   ??? Years of education: Not on file   ??? Highest education level: Not on file   Occupational History   ??? Not on file   Social Needs   ??? Financial resource strain: Not on file   ??? Food insecurity     Worry: Not on file     Inability: Not on file   ??? Transportation needs     Medical: Not on file     Non-medical: Not on file   Tobacco Use   ??? Smoking status: Former Smoker     Quit date: 10/09/1987     Years since quitting: 31.6   ??? Smokeless tobacco: Never Used   ??? Tobacco comment: smoked for 3 yrs of  and on   Substance and Sexual Activity   ??? Alcohol use: No   ??? Drug use: No   ??? Sexual activity: Yes     Partners: Female     Birth control/protection: None   Lifestyle   ??? Physical activity      Days per week: Not on file     Minutes per session: Not on file   ??? Stress: Not on file   Relationships   ??? Social connections     Talks on phone: Not on file     Gets together: Not on file     Attends religious service: Not on file     Active member of club or organization: Not on file     Attends meetings of clubs or organizations: Not on file     Relationship status: Not on file   ??? Intimate partner violence     Fear of current or ex partner: Not on file     Emotionally abused: Not on file     Physically abused: Not on   file     Forced sexual activity: Not on file   Other Topics Concern   ??? Not on file   Social History Narrative   ??? Not on file                ALLERGIES: Patient has no known allergies.    Review of Systems   All other systems reviewed and are negative.      Vitals:    05/16/19 1913   Pulse: 68   Resp: 16   Temp: 98.3 ??F (36.8 ??C)   SpO2: 95%       Physical Exam  Vitals signs and nursing note reviewed.   Constitutional:       General: He is not in acute distress.     Appearance: He is not ill-appearing.   Pulmonary:      Effort: Pulmonary effort is normal. No respiratory distress.      Breath sounds: Normal breath sounds.         MDM    Procedures      ICD-10-CM ICD-9-CM    1. Exposure to COVID-19 virus  Z20.822 V01.79 NOVEL CORONAVIRUS (COVID-19)     No orders of the defined types were placed in this encounter.    No results found for any visits on 05/16/19.  The patients condition was discussed with the patient and they understand.  The patient is to follow up with primary care doctor.  If signs and symptoms become worse the pt is to go to the ER. The patient is to take medications as prescribed.

## 2019-05-19 LAB — NOVEL CORONAVIRUS (COVID-19): SARS-CoV-2, NAA: NOT DETECTED

## 2019-05-19 LAB — COVID-19: SARS-CoV-2, NAA: NOT DETECTED

## 2019-12-03 ENCOUNTER — Ambulatory Visit: Attending: Internal Medicine | Primary: Internal Medicine

## 2019-12-03 ENCOUNTER — Ambulatory Visit
Admit: 2019-12-03 | Discharge: 2019-12-03 | Payer: BLUE CROSS/BLUE SHIELD | Attending: Internal Medicine | Primary: Internal Medicine

## 2019-12-03 DIAGNOSIS — Z0000 Encounter for general adult medical examination without abnormal findings: Secondary | ICD-10-CM

## 2019-12-03 LAB — AMB POC LIPID PROFILE
Cholesterol (POC): 159
Cholesterol, POC: 159 NA
HDL Cholesterol (POC): 61
HDL Cholesterol, POC: 61 NA
LDL Cholesterol (POC): 84 MG/DL
LDL Cholesterol, POC: 84 MG/DL
Non-HDL Cholesterol: 99
Non-HDL Cholesterol: 99 NA
TChol/HDL Ratio (POC): 2.6
TChol/HDL Ratio (POC): 2.6 NA
Triglycerides (POC): 73
Triglycerides, POC: 73 NA

## 2019-12-03 LAB — METABOLIC PANEL, BASIC
Anion gap: 4 mmol/L — ABNORMAL LOW (ref 5–15)
BUN/Creatinine ratio: 17 (ref 12–20)
BUN: 18 MG/DL (ref 6–20)
CO2: 31 mmol/L (ref 21–32)
Calcium: 9.6 MG/DL (ref 8.5–10.1)
Chloride: 105 mmol/L (ref 97–108)
Creatinine: 1.07 MG/DL (ref 0.70–1.30)
GFR est AA: >60 mL/min/{1.73_m2} (ref >60)
GFR est non-AA: >60 mL/min/{1.73_m2} (ref >60)
Glucose: 93 mg/dL (ref 65–100)
Potassium: 4.1 mmol/L (ref 3.5–5.1)
Sodium: 140 mmol/L (ref 136–145)

## 2019-12-03 LAB — CBC W/O DIFF
ABSOLUTE NRBC: 0 10*3/uL (ref 0.00–0.01)
HCT: 46.8 % (ref 36.6–50.3)
HGB: 15.7 g/dL (ref 12.1–17.0)
MCH: 31.1 PG (ref 26.0–34.0)
MCHC: 33.5 g/dL (ref 30.0–36.5)
MCV: 92.7 FL (ref 80.0–99.0)
MPV: 11.1 FL (ref 8.9–12.9)
NRBC: 0 PER 100 WBC
PLATELET: 192 10*3/uL (ref 150–400)
RBC: 5.05 M/uL (ref 4.10–5.70)
RDW: 12.7 % (ref 11.5–14.5)
WBC: 4.9 10*3/uL (ref 4.1–11.1)

## 2019-12-03 LAB — AMB POC HEMOGLOBIN A1C
Hemoglobin A1C, POC: 5.3 %
Hemoglobin A1c (POC): 5.3 %

## 2019-12-03 LAB — AMB POC GLUCOSE BLOOD, BY GLUCOSE MONITORING DEVICE
Glucose POC: 96 MG/DL
Glucose, POC: 96 MG/DL

## 2019-12-03 LAB — PSA, DIAGNOSTIC (PROSTATE SPECIFIC AG): Prostate Specific Ag: 1.1 ng/mL (ref 0.01–4.0)

## 2019-12-03 LAB — CBC
Hematocrit: 46.8 % (ref 36.6–50.3)
Hemoglobin: 15.7 g/dL (ref 12.1–17.0)
MCH: 31.1 PG (ref 26.0–34.0)
MCHC: 33.5 g/dL (ref 30.0–36.5)
MCV: 92.7 FL (ref 80.0–99.0)
MPV: 11.1 FL (ref 8.9–12.9)
NRBC Absolute: 0 10*3/uL (ref 0.00–0.01)
Nucleated RBCs: 0 PER 100 WBC
Platelets: 192 10*3/uL (ref 150–400)
RBC: 5.05 M/uL (ref 4.10–5.70)
RDW: 12.7 % (ref 11.5–14.5)
WBC: 4.9 10*3/uL (ref 4.1–11.1)

## 2019-12-03 LAB — BASIC METABOLIC PANEL
Anion Gap: 4 mmol/L — ABNORMAL LOW (ref 5–15)
BUN: 18 MG/DL (ref 6–20)
Bun/Cre Ratio: 17 (ref 12–20)
CO2: 31 mmol/L (ref 21–32)
Calcium: 9.6 MG/DL (ref 8.5–10.1)
Chloride: 105 mmol/L (ref 97–108)
Creatinine: 1.07 MG/DL (ref 0.70–1.30)
EGFR IF NonAfrican American: >60 mL/min/{1.73_m2} (ref >60)
GFR African American: >60 mL/min/{1.73_m2} (ref >60)
Glucose: 93 mg/dL (ref 65–100)
Potassium: 4.1 mmol/L (ref 3.5–5.1)
Sodium: 140 mmol/L (ref 136–145)

## 2019-12-03 LAB — PSA PROSTATIC SPECIFIC ANTIGEN: Pros. Spec. Antigen: 1.1 ng/mL (ref 0.01–4.0)

## 2019-12-03 NOTE — Progress Notes (Signed)
Alejandro Cohen is a 59 y.o. male and presents with Complete Physical  .  Subjective:    He needs  A physical exam(well)    Review of Systems  Constitutional: negative for fevers, chills, anorexia and weight loss  Eyes:   negative for visual disturbance and irritation  ENT:   negative for tinnitus,sore throat,nasal congestion,ear pains.hoarseness  Respiratory:  negative for cough, hemoptysis, dyspnea,wheezing  CV:   negative for chest pain, palpitations, lower extremity edema  GI:   negative for nausea, vomiting, diarrhea, abdominal pain,melena  Endo:               negative for polyuria,polydipsia,polyphagia,heat intolerance  Genitourinary: negative for frequency, dysuria and hematuria  Integument:  negative for rash and pruritus  Hematologic:  negative for easy bruising and gum/nose bleeding  Musculoskel: negative for myalgias, arthralgias, back pain, muscle weakness, joint pain  Neurological:  negative for headaches, dizziness, vertigo, memory problems and gait   Behavl/Psych: negative for feelings of anxiety, depression, mood changes    Past Medical History:   Diagnosis Date   ??? Contact dermatitis and other eczema, due to unspecified cause     acne     History reviewed. No pertinent surgical history.  Social History     Socioeconomic History   ??? Marital status: MARRIED     Spouse name: Not on file   ??? Number of children: Not on file   ??? Years of education: Not on file   ??? Highest education level: Not on file   Tobacco Use   ??? Smoking status: Former Smoker     Quit date: 10/09/1987     Years since quitting: 32.1   ??? Smokeless tobacco: Never Used   ??? Tobacco comment: smoked for 3 yrs of  and on   Substance and Sexual Activity   ??? Alcohol use: No   ??? Drug use: No   ??? Sexual activity: Yes     Partners: Female     Birth control/protection: None     Social Determinants of Psychologist, prison and probation services Strain:    ??? Difficulty of Paying Living Expenses:    Food Insecurity:    ??? Worried About Programme researcher, broadcasting/film/video in the  Last Year:    ??? Barista in the Last Year:    Transportation Needs:    ??? Freight forwarder (Medical):    ??? Lack of Transportation (Non-Medical):    Physical Activity:    ??? Days of Exercise per Week:    ??? Minutes of Exercise per Session:    Stress:    ??? Feeling of Stress :    Social Connections:    ??? Frequency of Communication with Friends and Family:    ??? Frequency of Social Gatherings with Friends and Family:    ??? Attends Religious Services:    ??? Database administrator or Organizations:    ??? Attends Banker Meetings:    ??? Marital Status:      Family History   Problem Relation Age of Onset   ??? Diabetes Mother    ??? Elevated Lipids Mother    ??? Migraines Mother    ??? Heart Disease Mother    ??? Hypertension Mother    ??? Psychiatric Disorder Mother    ??? Stroke Mother    ??? Alcohol abuse Father    ??? Diabetes Father    ??? Elevated Lipids Father    ??? Heart  Disease Father    ??? Hypertension Father    ??? Stroke Father    ??? Alcohol abuse Maternal Grandmother    ??? Alcohol abuse Maternal Grandfather    ??? Alcohol abuse Paternal Grandmother    ??? Alcohol abuse Paternal Grandfather        No Known Allergies    Objective:  Visit Vitals  BP 114/86   Pulse 64   Temp 98.2 ??F (36.8 ??C) (Oral)   Resp 16   Ht 5\' 9"  (1.753 m)   Wt 233 lb (105.7 kg)   SpO2 97%   BMI 34.41 kg/m??     Physical Exam:   General appearance - alert, well appearing, and in no distress  Mental status - alert, oriented to person, place, and time  EYE-PERLA, EOMI, corneas normal, no foreign bodies  ENT-ENT exam normal, no neck nodes or sinus tenderness  Nose - normal and patent, no erythema, discharge or polyps  Mouth - mucous membranes moist, pharynx normal without lesions  Neck - supple, no significant adenopathy   Chest - clear to auscultation, no wheezes, rales or rhonchi, symmetric air entry   Heart - normal rate, regular rhythm, normal S1, S2, no murmurs, rubs, clicks or gallops   Abdomen - soft, nontender, nondistended, no masses or  organomegaly  Lymph- no adenopathy palpable  Ext-peripheral pulses normal, no pedal edema, no clubbing or cyanosis  Skin-Warm and dry. no hyperpigmentation, vitiligo, or suspicious lesions  Neuro -alert, oriented, normal speech, no focal findings or movement disorder noted  Neck-normal C-spine, no tenderness, full ROM without pain  Feet-no nail deformities or callus formation with good pulses noted      Results for orders placed or performed in visit on 05/16/19   NOVEL CORONAVIRUS (COVID-19)   Result Value Ref Range    SARS-CoV-2, NAA Not Detected Not Detected       Assessment/Plan:    ICD-10-CM ICD-9-CM    1. Laboratory tests ordered as part of a complete physical exam (CPE)  Z00.00 V72.62 AMB POC LIPID PROFILE      AMB POC HEMOGLOBIN A1C      AMB POC GLUCOSE BLOOD, BY GLUCOSE MONITORING DEVICE      CBC W/O DIFF      METABOLIC PANEL, BASIC     Orders Placed This Encounter   ??? CBC W/O DIFF     Standing Status:   Future     Standing Expiration Date:   12/02/2020   ??? METABOLIC PANEL, BASIC     Standing Status:   Future     Standing Expiration Date:   12/02/2020   ??? AMB POC LIPID PROFILE   ??? AMB POC HEMOGLOBIN A1C   ??? AMB POC GLUCOSE BLOOD, BY GLUCOSE MONITORING DEVICE     lose weight, increase physical activity, routine labs ordered, call if any problems,Take 81mg  aspirin daily  Patient Instructions   MyChart Activation    Thank you for requesting access to MyChart. Please follow the instructions below to securely access and download your online medical record. MyChart allows you to send messages to your doctor, view your test results, renew your prescriptions, schedule appointments, and more.    How Do I Sign Up?    1. In your internet browser, go to [email or web address]  2. Click on the First Time User? Click Here link in the Sign In box. You will be redirect to the New Member Sign Up page.  3. Enter your MyChart Access Code exactly as it appears  below. You will not need to use this code after you???ve completed the  sign-up process. If you do not sign up before the expiration date, you must request a new code.    MyChart Access Code: Activation code not generated  Current MyChart Status: Active (This is the date your MyChart access code will expire)    4. Enter the last four digits of your Social Security Number (xxxx) and Date of Birth (mm/dd/yyyy) as indicated and click Submit. You will be taken to the next sign-up page.  5. Create a MyChart ID. This will be your MyChart login ID and cannot be changed, so think of one that is secure and easy to remember.  6. Create a MyChart password. You can change your password at any time.  7. Enter your Password Reset Question and Answer. This can be used at a later time if you forget your password.   8. Enter your e-mail address. You will receive e-mail notification when new information is available in MyChart.  9. Click Sign Up. You can now view and download portions of your medical record.  10. Click the Download Summary menu link to download a portable copy of your medical information.    Additional Information    If you have questions, please visit the Frequently Asked Questions section of the MyChart website at [email or web address] Remember, MyChart is NOT to be used for urgent needs. For medical emergencies, dial 911.           Follow-up and Dispositions    ?? Return in about 6 months (around 06/04/2020), or if symptoms worsen or fail to improve.           I have reviewed with the patient details of the assessment and plan and all questions were answered. Relevent patient education was performed.The most recent lab findings were reviewed with the patient.    An After Visit Summary was printed and given to the patient.

## 2019-12-03 NOTE — Addendum Note (Signed)
Addendum  Note by Horald Chestnut at 12/03/19 0845                Author: Horald Chestnut  Service: --  Author Type: --       Filed: 12/03/19 0922  Encounter Date: 12/03/2019  Status: Signed          Editor: Horald Chestnut          Addended by: Horald Chestnut on: 12/03/2019 09:22 AM    Modules accepted: Orders

## 2019-12-03 NOTE — Progress Notes (Signed)
 1. Have you been to the ER, urgent care clinic since your last visit?  Hospitalized since your last visit?no    2. Have you seen or consulted any other health care providers outside of the [facility] since your last visit?  Include any pap smears or colon screening. No    Chief Complaint   Patient presents with   . Complete Physical     3 most recent PHQ Screens 12/03/2019   PHQ Not Done -   Little interest or pleasure in doing things Not at all   Feeling down, depressed, irritable, or hopeless Not at all   Total Score PHQ 2 0     Per Dr. Helayne IVAR Landy Mickey.,  verbal order given for needed amb poc labs.

## 2019-12-03 NOTE — Addendum Note (Signed)
Addendum Note by Bronson Ing., MD at 12/03/19 0845                Author: Bronson Ing., MD  Service: --  Author Type: Physician       Filed: 12/03/19 0906  Encounter Date: 12/03/2019  Status: Signed          Editor: Bronson Ing., MD (Physician)          Addended by: Bronson Ing. on: 12/03/2019 09:06 AM    Modules accepted: Orders

## 2020-05-28 ENCOUNTER — Ambulatory Visit: Attending: Specialist | Primary: Internal Medicine

## 2020-05-28 ENCOUNTER — Ambulatory Visit
Admit: 2020-05-28 | Discharge: 2020-05-28 | Payer: BLUE CROSS/BLUE SHIELD | Attending: Specialist | Primary: Internal Medicine

## 2020-05-28 DIAGNOSIS — G4733 Obstructive sleep apnea (adult) (pediatric): Secondary | ICD-10-CM

## 2020-05-28 NOTE — Progress Notes (Signed)
Progress Notes by Charlestine Night, MD at 05/28/20 1120                Author: Charlestine Night, MD  Service: --  Author Type: Physician       Filed: 05/28/20 1816  Encounter Date: 05/28/2020  Status: Signed          Editor: Charlestine Night, MD (Physician)                                             [address] [place], [place] [address]   Tel.  [phone]   Fax. [phone]  [address]   [place], [place] [address]   Tel.  [phone]   Fax. [phone]  [address]   [place], [place] [address]   Tel.  [phone]   Fax. [phone]             Chief Complaint            Chief Complaint       Patient presents with        ?  Sleep Problem             NP; LOV 2019; need supplies             HPI         Alejandro Cohen is 60 y.o.  male seen for evaluation of a sleep disorder.  He was initially seen at [facility] on 12/29/2016.  At that time he presented with a history of daytime sleepiness, snoring, nocturnal awakening, witnessed apnea.  He noted a many year history of loud snoring.  Snoring was heard through closed doors in separate rooms.  Has  been told of associated apnea.  Snoring more prominent when supine.      Noted episodes of nocturnal awakening.  Described daytime fatigue primarily after lunch.      Evaluated with a home sleep test which demonstrated severe sleep disordered breathing characterized by an overall AHI of 30.6/h associated with minimal SaO2 of 73%.  Events  were more prominent supine with the supine-related AHI of 73.2/h.  Snoring noted during 53.3% of the recording.   ??   APAP 8-15 cm initiated.  Noted benefit with CPAP.  Patient had follow-up since that visit.      Current compliance demonstrated during the past 90 days, APAP 8-15 cm use during 39 days with CMS compliance 33%.  Average use 5 hours 5 minutes.  Average AHI 0.6.  Mask leak within acceptable limits.         Epworth Sleepiness Score: 11         No Known Allergies              He  has a past medical history of Contact dermatitis and other eczema,  due to unspecified cause.      He has no past medical history of Abuse, Anemia NEC, Arrhythmia, Arthritis, Asthma, Autoimmune disease (HCC), CAD (coronary artery disease), Calculus of kidney, Cancer (HCC),  Chronic kidney disease, Chronic pain, Congestive heart failure, unspecified, COPD, Depression, Diabetes (HCC), GERD (gastroesophageal reflux disease), Headache(784.0), Hypercholesterolemia, Hypertension, Liver disease, Psychotic disorder (HCC), PUD (peptic  ulcer disease), Seizures (HCC), Stroke (HCC), Thromboembolus (HCC), Thyroid disease, or Trauma.      He  has no past surgical history on  file.      He family history includes Alcohol abuse in his father, maternal grandfather, maternal  grandmother, paternal grandfather, and paternal grandmother; Diabetes in his father and mother; Elevated Lipids in his father and mother; Heart Disease in his father and mother; Hypertension in his father and mother; Migraines in his mother; Psychiatric  Disorder in his mother; Stroke in his father and mother.      He  reports that he quit smoking about 32 years ago. He has never used smokeless tobacco.  He reports that he does not drink alcohol and does not use drugs.       Review of Systems:   ROS           Objective:        Visit Vitals      BP  119/77     Pulse  64     Ht  5\' 9"  (1.753 m)     Wt  238 lb (108 kg)     SpO2  94%        BMI  35.15 kg/m??        Body mass index is 35.15 kg/m??.         General:    Conversant, cooperative        Eyes:   Pupils equal and reactive, no nystagmus        Oropharynx:    Mallampati score IV, tongue normal                                      CVS:   Normal rate, regular rhythm     Skin:   Warm to touch; no obvious rashes     Neuro:   Speech fluent, face symmetrical, tongue movement normal        Psych:   Normal affect,  normal countenance              Assessment:                  ICD-10-CM  ICD-9-CM              1.  OSA (obstructive sleep apnea)   G47.33  327.23          History of significant sleep disordered breathing responding to APAP.  Patient has not been compliant.      Patient was instructed to use CPAP nightly.  Follow-up appointment in 6 weeks.  Should be able to update supplies at that time once compliance is demonstrated.  PAP continues to benefit patient and remains necessary for control of  his sleep apnea            Plan:        No orders of the defined types were placed in this encounter.         * Patient has a history and examination consistent with the diagnosis of sleep apnea.   * Sleep testing was reviewed   * He was provided information on sleep apnea including corresponding risk factors and the importance of proper treatment.    * Treatment options if indicated were reviewed today.        Instructions:   o  A copy of compliance data was provided to the patient and reviewed in detail.   o  CPAP will be continued at the above pressure settings.  The patient is to contact the office  if there are problems with either mask or pressure settings.  Follow-up will be scheduled at which time compliance data will be reviewed.   o  The patient would benefit from weight reduction measures.   o  Do not engage in activities requiring a normal degree of alertness if fatigue is present.   o  The patient understands that untreated or undertreated sleep apnea could impair judgement and the ability to function normally during the day.   o  Call or return if symptoms worsen or persist.               [name], MD, FAASM   Electronically signed 05/28/20          This note was created using voice recognition software. Despite editing, there may be syntax errors.  This note will not be viewable  in MyChart.

## 2020-06-06 ENCOUNTER — Ambulatory Visit: Attending: Internal Medicine | Primary: Internal Medicine

## 2020-06-06 ENCOUNTER — Ambulatory Visit
Admit: 2020-06-06 | Discharge: 2020-06-06 | Payer: BLUE CROSS/BLUE SHIELD | Attending: Internal Medicine | Primary: Internal Medicine

## 2020-06-06 DIAGNOSIS — S5012XA Contusion of left forearm, initial encounter: Secondary | ICD-10-CM

## 2020-06-06 NOTE — Progress Notes (Signed)
 1. Have you been to the ER, urgent care clinic since your last visit?  Hospitalized since your last visit?no    2. Have you seen or consulted any other health care providers outside of the [facility] since your last visit?  Include any pap smears or colon screening. No      3 most recent PHQ Screens 06/06/2020   PHQ Not Done -   Little interest or pleasure in doing things Not at all   Feeling down, depressed, irritable, or hopeless Not at all   Total Score PHQ 2 0       Chief Complaint   Patient presents with   . Arm Pain     left

## 2020-06-06 NOTE — Progress Notes (Signed)
Alejandro Cohen is a 60 y.o. male and presents with Arm Pain (left)  .  Subjective:  Sleep apnea Review:  There is a history of excessive daytime fatigue with associated excessive snoring at night.  There is also a history of periods of apnea at night as witnessed by a partner and family  The patient is on CPAP.    His last labs were normal.  He has had pains involving the lt..arm region  He states he has been lifting weights    Review of Systems  Constitutional: negative for fevers, chills, anorexia and weight loss  Eyes:   negative for visual disturbance and irritation  ENT:   negative for tinnitus,sore throat,nasal congestion,ear pains.hoarseness  Respiratory:  negative for cough, hemoptysis, dyspnea,wheezing  CV:   negative for chest pain, palpitations, lower extremity edema  GI:   negative for nausea, vomiting, diarrhea, abdominal pain,melena  Endo:               negative for polyuria,polydipsia,polyphagia,heat intolerance  Genitourinary: negative for frequency, dysuria and hematuria  Integument:  negative for rash and pruritus  Hematologic:  negative for easy bruising and gum/nose bleeding  Musculoskel: myalgias, arthralgias,   Neurological:  negative for headaches, dizziness, vertigo, memory problems and gait   Behavl/Psych: negative for feelings of anxiety, depression, mood changes    Past Medical History:   Diagnosis Date   ??? Contact dermatitis and other eczema, due to unspecified cause     acne     History reviewed. No pertinent surgical history.  Social History     Socioeconomic History   ??? Marital status: MARRIED   Tobacco Use   ??? Smoking status: Former Smoker     Quit date: 10/09/1987     Years since quitting: 32.6   ??? Smokeless tobacco: Never Used   ??? Tobacco comment: smoked for 3 yrs of  and on   Substance and Sexual Activity   ??? Alcohol use: No   ??? Drug use: No   ??? Sexual activity: Yes     Partners: Female     Birth control/protection: None     Family History   Problem Relation Age of Onset   ???  Diabetes Mother    ??? Elevated Lipids Mother    ??? Migraines Mother    ??? Heart Disease Mother    ??? Hypertension Mother    ??? Psychiatric Disorder Mother    ??? Stroke Mother    ??? Alcohol abuse Father    ??? Diabetes Father    ??? Elevated Lipids Father    ??? Heart Disease Father    ??? Hypertension Father    ??? Stroke Father    ??? Alcohol abuse Maternal Grandmother    ??? Alcohol abuse Maternal Grandfather    ??? Alcohol abuse Paternal Grandmother    ??? Alcohol abuse Paternal Grandfather        No Known Allergies    Objective:  Visit Vitals  BP 104/76   Pulse 67   Temp 98 ??F (36.7 ??C) (Oral)   Resp 16   Ht 5\' 9"  (1.753 m)   Wt 238 lb (108 kg)   SpO2 97%   BMI 35.15 kg/m??     Physical Exam:   General appearance - alert, well appearing, and in no distress  Mental status - alert, oriented to person, place, and time  EYE-PERLA, EOMI, corneas normal, no foreign bodies  ENT-ENT exam normal, no neck nodes or sinus tenderness  Nose -  normal and patent, no erythema, discharge or polyps  Mouth - mucous membranes moist, pharynx normal without lesions  Neck - supple, no significant adenopathy   Chest - clear to auscultation, no wheezes, rales or rhonchi, symmetric air entry   Heart - normal rate, regular rhythm, normal S1, S2, no murmurs, rubs, clicks or gallops   Abdomen - soft, nontender, nondistended, no masses or organomegaly  Lymph- no adenopathy palpable  Ext-peripheral pulses normal, no pedal edema, no clubbing or cyanosis  Skin-Warm and dry. no hyperpigmentation, vitiligo, or suspicious lesions  Neuro -alert, oriented, normal speech, no focal findings or movement disorder noted  Neck-normal C-spine, no tenderness, full ROM without pain  Feet-no nail deformities or callus formation with good pulses noted  Lt.arm forearm tenderness to palpation    Results for orders placed or performed in visit on 12/03/19   PSA, DIAGNOSTIC (PROSTATE SPECIFIC AG)   Result Value Ref Range    Prostate Specific Ag 1.1 0.01 - 4.0 ng/mL   METABOLIC PANEL, BASIC    Result Value Ref Range    Sodium 140 136 - 145 mmol/L    Potassium 4.1 3.5 - 5.1 mmol/L    Chloride 105 97 - 108 mmol/L    CO2 31 21 - 32 mmol/L    Anion gap 4 (L) 5 - 15 mmol/L    Glucose 93 65 - 100 mg/dL    BUN 18 6 - 20 MG/DL    Creatinine 8.46 9.62 - 1.30 MG/DL    BUN/Creatinine ratio 17 12 - 20      GFR est AA >60 >60 ml/min/1.39m2    GFR est non-AA >60 >60 ml/min/1.54m2    Calcium 9.6 8.5 - 10.1 MG/DL   CBC W/O DIFF   Result Value Ref Range    WBC 4.9 4.1 - 11.1 K/uL    RBC 5.05 4.10 - 5.70 M/uL    HGB 15.7 12.1 - 17.0 g/dL    HCT 95.2 84.1 - 32.4 %    MCV 92.7 80.0 - 99.0 FL    MCH 31.1 26.0 - 34.0 PG    MCHC 33.5 30.0 - 36.5 g/dL    RDW 40.1 02.7 - 25.3 %    PLATELET 192 150 - 400 K/uL    MPV 11.1 8.9 - 12.9 FL    NRBC 0.0 0 PER 100 WBC    ABSOLUTE NRBC 0.00 0.00 - 0.01 K/uL   AMB POC LIPID PROFILE   Result Value Ref Range    Cholesterol (POC) 159     Triglycerides (POC) 73     HDL Cholesterol (POC) 61     Non-HDL Cholesterol 99     LDL Cholesterol (POC) 84 MG/DL    TChol/HDL Ratio (POC) 2.6    AMB POC HEMOGLOBIN A1C   Result Value Ref Range    Hemoglobin A1c (POC) 5.3 %   AMB POC GLUCOSE BLOOD, BY GLUCOSE MONITORING DEVICE   Result Value Ref Range    Glucose POC 96 MG/DL       Assessment/Plan:    ICD-10-CM ICD-9-CM    1. Contusion of left forearm, initial encounter  S50.12XA 923.10    2. Obstructive sleep apnea syndrome  G47.33 327.23    3. Severe obesity (BMI 35.0-39.9) with comorbidity (HCC)  E66.01 278.01      No orders of the defined types were placed in this encounter.    lose weight,Take 81mg  aspirin daily  Patient Instructions   MyChart Activation    Thank you  for requesting access to MyChart. Please follow the instructions below to securely access and download your online medical record. MyChart allows you to send messages to your doctor, view your test results, renew your prescriptions, schedule appointments, and more.    How Do I Sign Up?    1. In your internet browser, go to  [email or web address]  2. Click on the First Time User? Click Here link in the Sign In box. You will be redirect to the New Member Sign Up page.  3. Enter your MyChart Access Code exactly as it appears below. You will not need to use this code after you???ve completed the sign-up process. If you do not sign up before the expiration date, you must request a new code.    MyChart Access Code: Activation code not generated  Current MyChart Status: Active (This is the date your MyChart access code will expire)    4. Enter the last four digits of your Social Security Number (xxxx) and Date of Birth (mm/dd/yyyy) as indicated and click Submit. You will be taken to the next sign-up page.  5. Create a MyChart ID. This will be your MyChart login ID and cannot be changed, so think of one that is secure and easy to remember.  6. Create a MyChart password. You can change your password at any time.  7. Enter your Password Reset Question and Answer. This can be used at a later time if you forget your password.   8. Enter your e-mail address. You will receive e-mail notification when new information is available in MyChart.  9. Click Sign Up. You can now view and download portions of your medical record.  10. Click the Download Summary menu link to download a portable copy of your medical information.    Additional Information    If you have questions, please visit the Frequently Asked Questions section of the MyChart website at [email or web address] Remember, MyChart is NOT to be used for urgent needs. For medical emergencies, dial 911.           Follow-up and Dispositions    ?? Return in about 6 months (around 12/04/2020).           I have reviewed with the patient details of the assessment and plan and all questions were answered. Relevent patient education was performed.The most recent lab findings were reviewed with the patient.    An After Visit Summary was printed and given to the patient.

## 2020-07-09 ENCOUNTER — Ambulatory Visit: Attending: Specialist | Primary: Internal Medicine

## 2020-07-09 ENCOUNTER — Ambulatory Visit
Admit: 2020-07-09 | Discharge: 2020-07-09 | Payer: BLUE CROSS/BLUE SHIELD | Attending: Specialist | Primary: Internal Medicine

## 2020-07-09 DIAGNOSIS — G4733 Obstructive sleep apnea (adult) (pediatric): Secondary | ICD-10-CM

## 2020-07-09 NOTE — Progress Notes (Signed)
Progress Notes by Charlestine Night, MD at 07/09/20 0940                Author: Charlestine Night, MD  Service: --  Author Type: Physician       Filed: 07/09/20 0954  Encounter Date: 07/09/2020  Status: Signed          Editor: Charlestine Night, MD (Physician)                                             [address] [place], [place] [address]   Tel.  [phone]   Fax. [phone]  [address]   [place], [place] [address]   Tel.  [phone]   Fax. [phone]  [address]   [place], [place] [address]   Tel.  [phone]   Fax. [phone]                Chief Complaint            Chief Complaint       Patient presents with        ?  Sleep Problem             6 wk cpap follow up                HPI            Alejandro Cohen is a 60 y.o.  male seen for follow-up. He was initially seen at [facility] on  12/29/2016.  At that time he presented with a history of daytime sleepiness, snoring, nocturnal awakening, witnessed apnea.  He noted a many year history of loud snoring.  Snoring was heard through closed doors in separate rooms.  Has been told of  associated apnea.  Snoring more prominent when supine.   ??   Noted episodes of nocturnal awakening.  Described daytime fatigue primarily after lunch.   ??   Evaluated with a home sleep test which demonstrated severe sleep disordered breathing characterized by an overall AHI of 30.6/h associated with minimal SaO2 of 73%. ??Events were more prominent supine with the supine -related AHI of 73.2/h. ??Snoring noted during 53.3% of the recording.   ??   APAP 8-15 cm initiated.       Compliance data downloaded and reviewed in detail with the patient today. During the past 30 days, APAP used during 30 days with the average  daily use of 5.5 hours. CMS compliance criteria 93%. AHI 0.7 per hour.       Does not experience nocturnal awakening, nonrestorative sleep or excessive daytime sleepiness.      Epworth Sleepiness Scale: 9       No Known Allergies             He  has a past medical history of Contact dermatitis and other eczema,  due to unspecified cause.      He has no past medical history of Abuse, Anemia NEC, Arrhythmia, Arthritis, Asthma, Autoimmune disease (HCC), CAD (coronary artery disease), Calculus of kidney, Cancer (HCC),  Chronic kidney disease, Chronic pain, Congestive heart failure, unspecified, COPD, Depression, Diabetes (HCC), GERD (gastroesophageal reflux disease), Headache(784.0), Hypercholesterolemia, Hypertension, Liver disease, Psychotic disorder (HCC), PUD (peptic  ulcer disease), Seizures (HCC), Stroke (HCC), Thromboembolus (HCC), Thyroid disease, or Trauma.      He  has  no past surgical history on file.      He family history includes Alcohol abuse in his father, maternal grandfather, maternal  grandmother, paternal grandfather, and paternal grandmother; Diabetes in his father and mother; Elevated Lipids in his father and mother; Heart Disease in his father and mother; Hypertension in his father and mother; Migraines in his mother; Psychiatric  Disorder in his mother; Stroke in his father and mother.      He  reports that he quit smoking about 32 years ago. He has never used smokeless tobacco.  He reports that he does not drink alcohol and does not use drugs.       Review of Systems:   Unchanged per patient           Objective:        Visit Vitals      BP  122/76     Pulse  70     Ht  5\' 9"  (1.753 m)     Wt  237 lb (107.5 kg)     SpO2  98%        BMI  35.00 kg/m??        Body mass index is 35 kg/m??.          General:    Conversant, cooperative     Eyes:   Pupils equal and reactive, no nystagmus     Oropharynx:    Mallampati score III tongue  scalloped                       Chest/Lungs:   Clear on auscultation         CVS:   Normal rate, regular rhythm             Neuro:   Speech fluent, face symmetrical                          Assessment:                  ICD-10-CM  ICD-9-CM             1.  OSA (obstructive sleep  apnea)   G47.33  327.23  AMB SUPPLY ORDER           he is compliant with PAP therapy and PAP continues to benefit patient and remains necessary for control of  his sleep apnea.        Plan:          Orders Placed This Encounter        ?  AMB SUPPLY ORDER             Diagnosis: Obstructive Sleep Apnea ICD-10 Code (G47.33      CPAP mask and supplies - ??Patient preference, headgear, heated tubing, and filter; ??heated humidifier.      Wireless modem. Remote monitoring enrollment.      Oral/Nasal Combo Mask 1 every 3 months.   V9563 Oral Cushion Combo Mask (Replace) 2 per month.   O7564 Nasal Pillows Combo Mask (Replace) 2 per month.   P3295 Full Face Mask 1 every 3 months.   J8841 Full Face Mask Cushion 1 per month.   Y6063 Nasal Cushion (Replace) 2 per month.   K1601 Nasal Pillows (Replace) 2 per month.    U9323 Nasal Interface Mask 1 every 3 months.   F5732 Headgear 1 every 6 months.   K0254 Chinstrap 1 every 6 months.  L9767 Tubing 1 every 3 months.   H4193 Filter(s) Disposable 2 per month.   X9024 Filter(s) Non-Disposable 1 every 6 months.    O9735 Oral Interface 1 every 3 months.    H2992 Water Chamber for [employer] (Replace) 1 every 6 months.   E2683 Tubing with heating element 1 every 3 months.      ??                  [name], MD, Iona Hansen   Diplomate, American Board of Sleep Medicine   NPI [id number]   Electronically signed 07/09/20           *A copy of compliance data was provided to the patient and reviewed in detail.   *CPAP will be continued at the above pressure settings.  The patient is to contact the office if there are problems with either mask or pressure settings.  Follow-up will be scheduled at which time compliance data will be reviewed.   * Patient has a history and examination consistent with the diagnosis of sleep apnea.    * He was provided information on sleep apnea including corresponding risk factors and the importance of proper treatment.    * Treatment options if indicated  were reviewed today.             [name], MD, FAASM   Electronically signed 07/09/20            This note was created using voice recognition software. Despite editing, there may be syntax errors.  This note will not be viewable  in MyChart.

## 2020-07-09 NOTE — Progress Notes (Signed)
Faxed supply order to medical equipment company.

## 2020-12-04 ENCOUNTER — Encounter: Attending: Internal Medicine | Primary: Internal Medicine

## 2021-04-22 MED ORDER — NIRMATRELVIR 300 MG (150 MG X2)-RITONAVIR 100 MG TABLET,DOSE PACK(EUA)
300 mg (150 mg x 2)-100 mg | ORAL | 0 refills | Status: AC
Start: 2021-04-22 — End: ?

## 2021-07-10 ENCOUNTER — Encounter: Attending: Family | Primary: Internal Medicine

## 2021-12-28 ENCOUNTER — Encounter

## 2021-12-28 ENCOUNTER — Ambulatory Visit
Admit: 2021-12-28 | Discharge: 2021-12-28 | Payer: BLUE CROSS/BLUE SHIELD | Attending: Internal Medicine | Primary: Internal Medicine

## 2021-12-28 DIAGNOSIS — R350 Frequency of micturition: Secondary | ICD-10-CM

## 2021-12-28 LAB — COMPREHENSIVE METABOLIC PANEL
ALT: 44 U/L (ref 12–78)
AST: 19 U/L (ref 15–37)
Albumin/Globulin Ratio: 1.3 (ref 1.1–2.2)
Albumin: 4.4 g/dL (ref 3.5–5.0)
Alk Phosphatase: 70 U/L (ref 45–117)
Anion Gap: 5 mmol/L (ref 5–15)
BUN: 20 MG/DL (ref 6–20)
Bun/Cre Ratio: 19 (ref 12–20)
CO2: 28 mmol/L (ref 21–32)
Calcium: 9.5 MG/DL (ref 8.5–10.1)
Chloride: 104 mmol/L (ref 97–108)
Creatinine: 1.03 MG/DL (ref 0.70–1.30)
Est, Glom Filt Rate: >60 mL/min/{1.73_m2} (ref >60)
Globulin: 3.4 g/dL (ref 2.0–4.0)
Glucose: 101 mg/dL — ABNORMAL HIGH (ref 65–100)
Potassium: 4.3 mmol/L (ref 3.5–5.1)
Sodium: 137 mmol/L (ref 136–145)
Total Bilirubin: 0.3 MG/DL (ref 0.2–1.0)
Total Protein: 7.8 g/dL (ref 6.4–8.2)

## 2021-12-28 LAB — CBC
Hematocrit: 52.1 % — ABNORMAL HIGH (ref 36.6–50.3)
Hemoglobin: 16.6 g/dL (ref 12.1–17.0)
MCH: 29.1 PG (ref 26.0–34.0)
MCHC: 31.9 g/dL (ref 30.0–36.5)
MCV: 91.4 FL (ref 80.0–99.0)
MPV: 10.8 FL (ref 8.9–12.9)
Nucleated RBCs: 0 PER 100 WBC
Platelets: 202 10*3/uL (ref 150–400)
RBC: 5.7 M/uL (ref 4.10–5.70)
RDW: 13.1 % (ref 11.5–14.5)
WBC: 4.5 10*3/uL (ref 4.1–11.1)
nRBC: 0 10*3/uL (ref 0.00–0.01)

## 2021-12-28 LAB — LIPID PANEL
Chol/HDL Ratio: 3.1 (ref 0.0–5.0)
Cholesterol, Total: 198 MG/DL (ref <200)
HDL: 64 MG/DL
LDL Calculated: 120.4 MG/DL — ABNORMAL HIGH (ref 0–100)
Triglycerides: 68 MG/DL (ref <150)
VLDL Cholesterol Calculated: 13.6 MG/DL

## 2021-12-28 LAB — PSA, DIAGNOSTIC: PSA: 1.4 ng/mL (ref 0.01–4.0)

## 2021-12-28 NOTE — Progress Notes (Signed)
1. Have you been to the ER, urgent care clinic since your last visit?  Hospitalized since your last visit?No    2. Have you seen or consulted any other health care providers outside of the  Health System since your last visit?  Include any pap smears or colon screening. No     Chief Complaint   Patient presents with    Annual Exam         No data recorded

## 2021-12-28 NOTE — Progress Notes (Signed)
Alejandro Cohen is a 61 y.o. male and presents with Annual Exam  .  Subjective:    He has reported some frequency at times as well as some leaking and incomplete bladder emptying.    Ne needs a general evaluation at this time    Review of Systems  Constitutional: negative for fevers, chills, anorexia and weight loss  Eyes:   negative for visual disturbance and irritation  ENT:   negative for tinnitus,sore throat,nasal congestion,ear pains.hoarseness  Respiratory:  negative for cough, hemoptysis, dyspnea,wheezing  CV:   negative for chest pain, palpitations, lower extremity edema  GI:   negative for nausea, vomiting, diarrhea, abdominal pain,melena  Endo:               negative for polyuria,polydipsia,polyphagia,heat intolerance  Genitourinary: negative for frequency, dysuria and hematuria  Integument:  negative for rash and pruritus  Hematologic:  negative for easy bruising and gum/nose bleeding  Musculoskel: negative for myalgias, arthralgias, back pain, muscle weakness, joint pain  Neurological:  negative for headaches, dizziness, vertigo, memory problems and gait   Behavl/Psych: negative for feelings of anxiety, depression, mood changes    Past Medical History:   Diagnosis Date    Contact dermatitis and other eczema, due to unspecified cause     acne     No past surgical history on file.  Social History     Socioeconomic History    Marital status: Married   Tobacco Use    Smoking status: Former     Types: Cigarettes     Quit date: 10/09/1987     Years since quitting: 34.2    Smokeless tobacco: Never    Tobacco comments:     Quit smoking: smoked for 3 yrs of  and on   Substance and Sexual Activity    Alcohol use: No    Drug use: No     Family History   Problem Relation Age of Onset    Alcohol Abuse Father     Diabetes Father     Psychiatric Disorder Mother     Hypertension Mother     Heart Disease Mother     Migraines Mother     Elevated Lipids Mother     Diabetes Mother     Alcohol Abuse Paternal Grandfather      Alcohol Abuse Paternal Grandmother     Alcohol Abuse Maternal Grandfather     Alcohol Abuse Maternal Grandmother     Stroke Father     Hypertension Father     Heart Disease Father     Elevated Lipids Father     Stroke Mother      Current Outpatient Medications   Medication Sig Dispense Refill    nirmatrelvir/ritonavir 300/100 (PAXLOVID) 20 x 150 MG & 10 x 100MG  TBPK Sig:3 tabs po bid for 5 days (Patient not taking: Reported on 12/28/2021)       No current facility-administered medications for this visit.     Not on File    Objective:  BP 120/66   Pulse 68   Temp 98 F (36.7 C) (Oral)   Resp 16   Ht 5\' 9"  (1.753 m)   Wt 231 lb 14.4 oz (105.2 kg)   SpO2 98%   BMI 34.25 kg/m   Physical Exam:   General appearance - alert, well appearing, and in no distress  Mental status - alert, oriented to person, place, and time  EYE-PERLA, EOMI, corneas normal, no foreign bodies  ENT-ENT exam normal, no  neck nodes or sinus tenderness  Nose - normal and patent, no erythema, discharge or polyps  Mouth - mucous membranes moist, pharynx normal without lesions  Neck - supple, no significant adenopathy   Chest - clear to auscultation, no wheezes, rales or rhonchi, symmetric air entry   Heart - normal rate, regular rhythm, normal S1, S2, no murmurs, rubs, clicks or gallops   Abdomen - soft, nontender, nondistended, no masses or organomegaly  Lymph- no adenopathy palpable  Ext-peripheral pulses normal, no pedal edema, no clubbing or cyanosis  Skin-Warm and dry. no hyperpigmentation, vitiligo, or suspicious lesions  Neuro -alert, oriented, normal speech, no focal findings or movement disorder noted  Neck-normal C-spine, no tenderness, full ROM without pain  Feet-no nail deformities or callus formation with good pulses noted      No results found for this visit on 12/28/21.    Assessment/Plan:    ICD-10-CM    1. Screening for colon cancer  Z12.11 Occult Blood Stool Immunoassay      2. Frequency of urination  R35.0 CBC      Comprehensive Metabolic Panel     PSA, Diagnostic      3. Screening cholesterol level  Z13.220 Lipid Panel        Orders Placed This Encounter   Procedures    Occult Blood Stool Immunoassay     Standing Status:   Future     Standing Expiration Date:   12/29/2022    CBC     Standing Status:   Future     Standing Expiration Date:   12/29/2022    Comprehensive Metabolic Panel     Standing Status:   Future     Standing Expiration Date:   12/29/2022    Lipid Panel     Standing Status:   Future     Standing Expiration Date:   12/29/2022     Order Specific Question:   Is Patient Fasting?/# of Hours     Answer:   8 hours     Order Specific Question:   Has the patient fasted?     Answer:   Yes    PSA, Diagnostic     Standing Status:   Future     Standing Expiration Date:   12/29/2022     lose weight, increase physical activity, routine labs ordered, call if any problems,  Patient Instructions   Thank you for enrolling in MyChart. Please follow the instructions below to securely access your online medical record. MyChart allows you to send messages to your doctor, view your test results, renew your prescriptions, schedule appointments, and more.     How Do I Sign Up?  In your Internet browser, go to [email or web address]  Click on the Sign Up Now link in the Sign In box. You will see the New Member Sign Up page.  Enter your MyChart Access Code exactly as it appears below. You will not need to use this code after you've completed the sign-up process. If you do not sign up before the expiration date, you must request a new code.  MyChart Access Code: Activation code not generated  Current MyChart Status: Active    Enter your Social Security Number ([id number]) and Date of Birth (mm/dd/yyyy) as indicated and click Submit. You will be taken to the next sign-up page.  Create a MyChart ID. This will be your MyChart login ID and cannot be changed, so think of one that is secure and easy to remember.  Create a  MyChart password. You can change your password at any time.  Enter your Password Reset Question and Answer. This can be used at a later time if you forget your password.   Enter your e-mail address. You will receive e-mail notification when new information is available in MyChart.  Click Sign Up. You can now view your medical record.     Additional Information  If you have questions, please contact your physician practice where you receive care. Remember, MyChart is NOT to be used for urgent needs. For medical emergencies, dial 911.     Follow-up and Dispositions    Return in about 2 weeks (around 01/11/2022), or if symptoms worsen or fail to improve.           I have reviewed with the patient details of the assessment and plan and all questions were answered. Relevent patient education was performed.The most recent lab findings were reviewed with the patient.    An After Visit Summary was printed and given to the patient.

## 2021-12-28 NOTE — Patient Instructions (Signed)
Thank you for enrolling in MyChart. Please follow the instructions below to securely access your online medical record. MyChart allows you to send messages to your doctor, view your test results, renew your prescriptions, schedule appointments, and more.     How Do I Sign Up?  In your Internet browser, go to [email or web address]  Click on the Sign Up Now link in the Sign In box. You will see the New Member Sign Up page.  Enter your MyChart Access Code exactly as it appears below. You will not need to use this code after you've completed the sign-up process. If you do not sign up before the expiration date, you must request a new code.  MyChart Access Code: Activation code not generated  Current MyChart Status: Active    Enter your Social Security Number ([id number]) and Date of Birth (mm/dd/yyyy) as indicated and click Submit. You will be taken to the next sign-up page.  Create a MyChart ID. This will be your MyChart login ID and cannot be changed, so think of one that is secure and easy to remember.  Create a MyChart password. You can change your password at any time.  Enter your Password Reset Question and Answer. This can be used at a later time if you forget your password.   Enter your e-mail address. You will receive e-mail notification when new information is available in MyChart.  Click Sign Up. You can now view your medical record.     Additional Information  If you have questions, please contact your physician practice where you receive care. Remember, MyChart is NOT to be used for urgent needs. For medical emergencies, dial 911.

## 2022-01-05 LAB — OCCULT BLOOD STOOL IMMUNOASSAY: Occult Blood Fecal: NEGATIVE

## 2022-01-11 ENCOUNTER — Ambulatory Visit: Admit: 2022-01-11 | Payer: BLUE CROSS/BLUE SHIELD | Attending: Internal Medicine | Primary: Internal Medicine

## 2022-01-11 DIAGNOSIS — N401 Enlarged prostate with lower urinary tract symptoms: Secondary | ICD-10-CM

## 2022-01-11 MED ORDER — TAMSULOSIN HCL 0.4 MG PO CAPS
0.4 MG | ORAL_CAPSULE | Freq: Every day | ORAL | 3 refills | Status: AC
Start: 2022-01-11 — End: ?

## 2022-01-11 MED ORDER — SILDENAFIL CITRATE 100 MG PO TABS
100 MG | ORAL_TABLET | Freq: Every day | ORAL | 1 refills | Status: DC | PRN
Start: 2022-01-11 — End: 2023-06-15

## 2022-01-11 NOTE — Progress Notes (Signed)
1. Have you been to the ER, urgent care clinic since your last visit?  Hospitalized since your last visit?No    2. Have you seen or consulted any other health care providers outside of the [facility] since your last visit?  Include any pap smears or colon screening. No           Results

## 2022-01-11 NOTE — Patient Instructions (Signed)
Thank you for enrolling in MyChart. Please follow the instructions below to securely access your online medical record. MyChart allows you to send messages to your doctor, view your test results, renew your prescriptions, schedule appointments, and more.     How Do I Sign Up?  In your Internet browser, go to [email or web address]  Click on the Sign Up Now link in the Sign In box. You will see the New Member Sign Up page.  Enter your MyChart Access Code exactly as it appears below. You will not need to use this code after you've completed the sign-up process. If you do not sign up before the expiration date, you must request a new code.  MyChart Access Code: Activation code not generated  Current MyChart Status: Active    Enter your Social Security Number ([id number]) and Date of Birth (mm/dd/yyyy) as indicated and click Submit. You will be taken to the next sign-up page.  Create a MyChart ID. This will be your MyChart login ID and cannot be changed, so think of one that is secure and easy to remember.  Create a MyChart password. You can change your password at any time.  Enter your Password Reset Question and Answer. This can be used at a later time if you forget your password.   Enter your e-mail address. You will receive e-mail notification when new information is available in MyChart.  Click Sign Up. You can now view your medical record.     Additional Information  If you have questions, please contact your physician practice where you receive care. Remember, MyChart is NOT to be used for urgent needs. For medical emergencies, dial 911.

## 2022-01-11 NOTE — Progress Notes (Signed)
Alejandro Cohen is a 61 y.o. male and presents with Results  .  Subjective:    He still has reported some frequency at times as well as some leaking and incomplete bladder emptying.  His last PSA was normal    Erectile dysfunction Review::  Patient complains of difficulty in maintaining an adequate erection.  He states that his present medication is helping thus far.        Review of Systems  Constitutional: negative for fevers, chills, anorexia and weight loss  Eyes:   negative for visual disturbance and irritation  ENT:   negative for tinnitus,sore throat,nasal congestion,ear pains.hoarseness  Respiratory:  negative for cough, hemoptysis, dyspnea,wheezing  CV:   negative for chest pain, palpitations, lower extremity edema  GI:   negative for nausea, vomiting, diarrhea, abdominal pain,melena  Endo:               negative for polyuria,polydipsia,polyphagia,heat intolerance  Genitourinary: negative for frequency, dysuria and hematuria  Integument:  negative for rash and pruritus  Hematologic:  negative for easy bruising and gum/nose bleeding  Musculoskel: negative for myalgias, arthralgias, back pain, muscle weakness, joint pain  Neurological:  negative for headaches, dizziness, vertigo, memory problems and gait   Behavl/Psych: negative for feelings of anxiety, depression, mood changes    Past Medical History:   Diagnosis Date    Contact dermatitis and other eczema, due to unspecified cause     acne     No past surgical history on file.  Social History     Socioeconomic History    Marital status: Married   Tobacco Use    Smoking status: Former     Types: Cigarettes     Quit date: 10/09/1987     Years since quitting: 34.2    Smokeless tobacco: Never    Tobacco comments:     Quit smoking: smoked for 3 yrs of  and on   Substance and Sexual Activity    Alcohol use: No    Drug use: No     Family History   Problem Relation Age of Onset    Alcohol Abuse Father     Diabetes Father     Psychiatric Disorder Mother      Hypertension Mother     Heart Disease Mother     Migraines Mother     Elevated Lipids Mother     Diabetes Mother     Alcohol Abuse Paternal Grandfather     Alcohol Abuse Paternal Grandmother     Alcohol Abuse Maternal Grandfather     Alcohol Abuse Maternal Grandmother     Stroke Father     Hypertension Father     Heart Disease Father     Elevated Lipids Father     Stroke Mother      Current Outpatient Medications   Medication Sig Dispense Refill    tamsulosin (FLOMAX) 0.4 MG capsule Take 1 capsule by mouth daily 90 capsule 3    sildenafil (VIAGRA) 100 MG tablet Take 1 tablet by mouth daily as needed for Erectile Dysfunction 30 tablet 1     No current facility-administered medications for this visit.     No Known Allergies    Objective:  BP 110/76   Pulse 65   Temp 98 F (36.7 C) (Oral)   Resp 16   Ht 5\' 9"  (1.753 m)   Wt 238 lb (108 kg)   SpO2 96%   BMI 35.15 kg/m   Physical Exam:   General  appearance - alert, well appearing, and in no distress  Mental status - alert, oriented to person, place, and time  EYE-PERLA, EOMI, corneas normal, no foreign bodies  ENT-ENT exam normal, no neck nodes or sinus tenderness  Nose - normal and patent, no erythema, discharge or polyps  Mouth - mucous membranes moist, pharynx normal without lesions  Neck - supple, no significant adenopathy   Chest - clear to auscultation, no wheezes, rales or rhonchi, symmetric air entry   Heart - normal rate, regular rhythm, normal S1, S2, no murmurs, rubs, clicks or gallops   Abdomen - soft, nontender, nondistended, no masses or organomegaly  Lymph- no adenopathy palpable  Ext-peripheral pulses normal, no pedal edema, no clubbing or cyanosis  Skin-Warm and dry. no hyperpigmentation, vitiligo, or suspicious lesions  Neuro -alert, oriented, normal speech, no focal findings or movement disorder noted  Neck-normal C-spine, no tenderness, full ROM without pain  Feet-no nail deformities or callus formation with good pulses noted      No results  found for this visit on 01/11/22.    Assessment/Plan:    ICD-10-CM    1. Benign prostatic hyperplasia with incomplete bladder emptying  N40.1     R39.14       2. Erectile disorder  N52.9           No orders of the defined types were placed in this encounter.    lose weight, increase physical activity, routine labs ordered, call if any problems,  Patient Instructions   Thank you for enrolling in MyChart. Please follow the instructions below to securely access your online medical record. MyChart allows you to send messages to your doctor, view your test results, renew your prescriptions, schedule appointments, and more.     How Do I Sign Up?  In your Internet browser, go to [email or web address]  Click on the Sign Up Now link in the Sign In box. You will see the New Member Sign Up page.  Enter your MyChart Access Code exactly as it appears below. You will not need to use this code after you've completed the sign-up process. If you do not sign up before the expiration date, you must request a new code.  MyChart Access Code: Activation code not generated  Current MyChart Status: Active    Enter your Social Security Number ([id number]) and Date of Birth (mm/dd/yyyy) as indicated and click Submit. You will be taken to the next sign-up page.  Create a MyChart ID. This will be your MyChart login ID and cannot be changed, so think of one that is secure and easy to remember.  Create a Clinical biochemist. You can change your password at any time.  Enter your Password Reset Question and Answer. This can be used at a later time if you forget your password.   Enter your e-mail address. You will receive e-mail notification when new information is available in MyChart.  Click Sign Up. You can now view your medical record.     Additional Information  If you have questions, please contact your physician practice where you receive care. Remember, MyChart is NOT to be used for urgent needs. For medical  emergencies, dial 911.     Follow-up and Dispositions    Return in about 3 months (around 04/12/2022).             I have reviewed with the patient details of the assessment and plan and all questions were answered. Relevent patient education was performed.The most recent lab findings  were reviewed with the patient.    An After Visit Summary was printed and given to the patient.

## 2022-05-28 ENCOUNTER — Telehealth
Admit: 2022-05-28 | Discharge: 2022-05-28 | Payer: BLUE CROSS/BLUE SHIELD | Attending: Adult Health | Primary: Internal Medicine

## 2022-05-28 DIAGNOSIS — G4733 Obstructive sleep apnea (adult) (pediatric): Secondary | ICD-10-CM

## 2022-05-28 NOTE — Progress Notes (Signed)
DME faxed to [facility] 05/28/22

## 2022-05-28 NOTE — Patient Instructions (Signed)
[address]  [place] [address]  Tel.  [phone]  Fax. [phone] [address]  [place] [address]  Tel.  [phone]  Fax. [phone] [address] [place] [address]  Tel.  [phone]  Fax. [phone]     Learning About CPAP for Sleep Apnea  What is CPAP?              CPAP is a small machine that you use at home every night while you sleep. It increases air pressure in your throat to keep your airway open. When you have sleep apnea, this can help you sleep better so you feel much better. CPAP stands for "continuous positive airway pressure."  The CPAP machine will have one of the following:  A mask that covers your nose and mouth  Prongs that fit into your nose  A mask that covers your nose only, the most common type. This type is called NCPAP. The N stands for "nasal."  Why is it done?  CPAP is usually the best treatment for obstructive sleep apnea. It is the first treatment choice and the most widely used. Your doctor may suggest CPAP if you have:  Moderate to severe sleep apnea.  Sleep apnea and coronary artery disease (CAD) or heart failure.  How does it help?  CPAP can help you have more normal sleep, so you feel less sleepy and more alert during the daytime.  CPAP may help keep heart failure or other heart problems from getting worse.  NCPAP may help lower your blood pressure.  If you use CPAP, your bed partner may also sleep better because you are not snoring or restless.  What are the side effects?  Some people who use CPAP have:  A dry or stuffy nose and a sore throat.  Irritated skin on the face.  Sore eyes.  Bloating.  If you have any of these problems, work with your doctor to fix them. Here are some things you can try:  Be sure the mask or nasal prongs fit well.  See if your doctor can adjust the pressure of your CPAP.  If your nose is dry, try a humidifier.  If your nose is runny or stuffy, try decongestant medicine or a steroid  nasal spray.  If these things do not help, you might try a different type of machine. Some machines have air pressure that adjusts on its own. Others have air pressures that are different when you breathe in than when you breathe out. This may reduce discomfort caused by too much pressure in your nose.               Where can you learn more?   Go to http://www.healthwise.net/[facility]  Enter X266 in the search box to learn more about "Learning About CPAP for Sleep Apnea."    2006-2011 Health[employer] Inc[employer]. Care instructions adapted under license by [employer] (which disclaims liability or warranty for this information). This care instruction is for use with your licensed healthcare professional. If you have questions about a medical condition or this instruction, always ask your healthcare professional. Healthwise, Incorporated disclaims any warranty or liability for your use of this information.  Content Version: 8.9.83828; Last Revised: April 29, 2008  PROPER SLEEP HYGIENE    What to avoid  Do not have drinks with caffeine, such as coffee or black tea, for 8 hours before bed.  Do not smoke or use other types of tobacco near bedtime. Nicotine   is a stimulant and can keep you awake.  Avoid drinking alcohol late in the evening, because it can cause you to wake in the middle of the night.  Do not eat a big meal close to bedtime. If you are hungry, eat a light snack.  Do not drink a lot of water close to bedtime, because the need to urinate may wake you up during the night.  Do not read or watch TV in bed. Use the bed only for sleeping and sexual activity.  What to try  Go to bed at the same time every night, and wake up at the same time every morning. Do not take naps during the day.  Keep your bedroom quiet, dark, and cool.  Get regular exercise, but not within 3 to 4 hours of your bedtime..  Sleep on a comfortable pillow and mattress.  If watching the clock makes you anxious, turn it facing away from you so  you cannot see the time.  If you worry when you lie down, start a worry book. Well before bedtime, write down your worries, and then set the book and your concerns aside.  Try meditation or other relaxation techniques before you go to bed.  If you cannot fall asleep, get up and go to another room until you feel sleepy. Do something relaxing. Repeat your bedtime routine before you go to bed again.  Make your house quiet and calm about an hour before bedtime. Turn down the lights, turn off the TV, log off the computer, and turn down the volume on music. This can help you relax after a busy day.    Drowsy Driving: The [place] National Highway Traffic Safety Administration cites drowsiness as a causing factor in more than 100,000 police reported crashes annually, resulting in 76,000 injuries and 1,500 deaths. Other surveys suggest 55% of people polled have driven while drowsy in the past year, 23% had fallen asleep but not crashed, 3% crashed, and 2% had and accident due to drowsy driving.  Who is at risk?   Young Drivers: One study of drowsy driving accidents states that 55% of the drivers were under 25 years. Of those, 75% were male.   Shift Workers and Travelers: People who work overnight or travel across time zones frequently are at higher risk of experiencing Circadian Rhythm Disorders. They are trying to work and function when their body is programed to sleep.   Sleep Deprived: Lack of sleep has a serious impact on your ability to pay attention or focus on a task. Consistently getting less than the average of 8 hours your body needs creates partial or cumulative sleep deprivation.   Untreated Sleep Disorders: Sleep Apnea, Narcolepsy, R.L.S., and other sleep disorders (untreated) prevent a person from getting enough restful sleep. This leads to excessive daytime sleepiness and increases the risk for drowsy driving accidents by up to 7 times.  Medications / Alcohol: Even over the counter medications can cause  drowsiness. Medications that impair a drivers attention should have a warning label. Alcohol naturally makes you sleepy and on its own can cause accidents. Combined with excessive drowsiness its effects are amplified.   Signs of Drowsy Driving:   * You don't remember driving the last few miles   * You may drift out of your lane   * You are unable to focus and your thoughts wander   * You may yawn more often than normal   * You have difficulty keeping your eyes open / nodding   off   * Missing traffic signs, speeding, or tailgating  Prevention-   Good sleep hygiene, lifestyle and behavioral choices have the most impact on drowsy driving. There is no substitute for sleep and the average person requires 8 hours nightly. If you find yourself driving drowsy, stop and sleep. Consider the sleep hygiene tips provided during your visit as well.     Medication Refill Policy: Refills for all medications require 1 week advance notice. Please have your pharmacy fax a refill request. We are unable to fax, or call in "controled substance" medications and you will need to pick these prescriptions up from our office.

## 2022-05-28 NOTE — Progress Notes (Signed)
Alejandro Cohen (DOB: [birth year]) is a 62 y.o. male, established patient, seen for positive airway pressure follow-up, he was last seen by Dr. Robby Sermon on 07/09/2020, prior notes and sleep testing reviewed in detail. Home sleep test 12/2016 showed AHI of 30.6/hr with a lowest SpO2 of 73%, duration of SpO2 < 88% 30.2 min. Weight at time of sleep testing 238 pounds. Sleep study report added to media by provider.    ASSESSMENT/PLAN:   Diagnosis Orders   1. OSA (obstructive sleep apnea)  DME Order for [facility]) as OP      2. Adult BMI 35.0-35.9 kg/sq m            AHI = 30.6(12/2016).  On ResMed APA :  8-15 cmH2O. Set up 01/17/2017.    He is adherent with PAP therapy and PAP continues to benefit patient and remains necessary for control of his sleep apnea. He is eligible for a new device and would like to order.    Follow-up and Dispositions    Return in about 3 months (around 08/26/2022) for First adherence visit 31-90 days after new setup.         Sleep Apnea well controlled, continue with current pressures at APAP 8-15 cmH2O.  New APAP device needed, current device has exceeded its life expectancy, is outdated and new technology is required, prescription for PAP. He will contact us once he gets device to confirm 1st adherence visit.    Orders Placed This Encounter   Procedures    DME Order for [facility]) as OP     Diagnosis: (G47.33) OSA (obstructive sleep apnea)  (primary encounter diagnosis)      New APAP device needed, current device has exceeded its life expectancy, is outdated and new technology is required.    Positive Airway Pressure Therapy: Duration of need: 99 months.   Z6109  ResMed APAP Device: Minimum Pressure: 8 cmH2O, Maximum Pressure: 15 cmH2O.  Ramp Time: 20 Minutes.  EPR: 2.  U0454 Heated Humidifier  Grant Modem Access  Length of Need: 99 months    A7033 Nasal Pillows (Replace) 2 per month.   U9811 Nasal Interface Mask 1 every 3 months.    B1478 Pos Airway pressure chin strap  A7035 Headgear 1 every 6  months.    G9562 Tubing with heating element 1 every 3 months.    Z3086 Filter(s) Disposable 2 per month.  V7846 Filter(s) Non-Disposable 1 every 6 months.   .   [place] for Humidifier (Replace) 1 every 6 months.      Carter Kitten, AGPCNP-BC; NPI: [id number]    Electronically signed. Date:- 05/28/22       * Counseling was provided regarding the importance of regular PAP use with emphasis on ensuring sufficient total sleep time, proper sleep hygiene, and safe driving.    * Re-enforced proper and regular cleaning for the device.  We discussed the risk associated with use of cleaning devices and he will continue with use of dish soap and water as the appropriate cleaning method.    * He was asked to contact our office for any problems regarding PAP therapy.    2. Encouraged continued weight management through appropriate diet and exercise regimen as significant weight reduction has been shown to reduce severity of obstructive sleep apnea.     SUBJECTIVE/OBJECTIVE:    He  is seen today for follow up on PAP device and reports no problems using the device.   The following concerns reviewed:  Drowsiness no Problems exhaling no   Snoring no Forget to put on no   Mask Comfortable yes Can't fall asleep no   Dry Mouth no Mask falls off no   Air Leaking no Frequent awakenings no       He reports that his sleep has improved on PAP therapy using nasal pillows mask and heated tubing. He reports that he is doing well with device and would like a replacement, he was called by [facility] who informed him he was eligible for a new device.   Recent PCP visit reviewed, he is still waking up to use the restroom but generally able to get back to sleep.     Review of device download indicated:  Auto pressure: 8-15 cmH2O; Max Pressure: 12.3 cmH2O;  95th percentile Pressure: 11.5 cmH2O   95th Percentile Leak: 3.1 L/Min     % Used Days >= 4 hours: 80.  Avg hours used:  5:17.    Therapy Apnea Index averaged over PAP use: 0.5 /hr  which reflects improved sleep breathing condition.    Epworth Sleepiness Score: 0 and Modified F.O.S.Q. Score Total / 2: 20  which reflects improved sleep quality over therapy time.    CO2 28 mmol/L (range 21-32) on labs 12/28/2021    Sleep Review of Systems: notable for Negative difficulty falling asleep; Positive awakenings at night; Negative early morning headaches; Negative memory problems; Negative concentration issues; Negative chest pain; Negative shortness of breath; Negative significant joint pain at night; Negative rashes or itching; Negative heartburn; Negative significant mood issues; occasional afternoon naps     Vitals reported by patient       05/28/2022     9:22 AM   Patient-Reported Vitals   Patient-Reported Weight 238lb   Patient-Reported Height 5'8"      Calculated BMI 35    Physical Exam completed by visual and auditory observation of patient with verbal input from patient.    General:   Alert, oriented, not in acute distress   Eyes:  Anicteric Sclerae; no obvious strabismus   Nose:  No obvious nasal septum deviation    Neck:   Midline trachea, no visible mass   Chest/Lungs:  Respiratory effort normal, no visualized signs of difficulty breathing or respiratory distress   CVS:  No JVD   Extremities:  No obvious rashes noted on face, neck, or hands   Neuro:  No facial asymmetry, no focal deficits; no obvious tremor    Psych:  Normal affect,  normal countenance       Alejandro Cohen, was evaluated through a synchronous (real-time) audio-video encounter. The patient (or guardian if applicable) is aware that this is a billable service, which includes applicable co-pays. This Virtual Visit was conducted with patient's (and/or legal guardian's) consent. Patient identification was verified, and a caregiver was present when appropriate.   The patient was located at Home: [address]  [place] [address]  Provider was located at [facility] (Appt Dept): [address], [address]  [place]  [address]    Patient's phone number [phone] (cell) was confirmed for accuracy.  He gives permission for messages regarding results and appointments to be left at that number.    An electronic signature was used to authenticate this note.    -- Carter Kitten, APRN - NP, [facility]  05/28/22

## 2022-06-06 ENCOUNTER — Ambulatory Visit
Admit: 2022-06-06 | Discharge: 2022-06-06 | Payer: BLUE CROSS/BLUE SHIELD | Attending: Family | Primary: Internal Medicine

## 2022-06-06 DIAGNOSIS — J069 Acute upper respiratory infection, unspecified: Secondary | ICD-10-CM

## 2022-06-06 LAB — AMB POC RAPID STREP A: Group A Strep Antigen, POC: NEGATIVE

## 2022-06-06 MED ORDER — BENZONATATE 200 MG PO CAPS
200 | ORAL_CAPSULE | Freq: Three times a day (TID) | ORAL | 0 refills | Status: AC | PRN
Start: 2022-06-06 — End: 2022-06-13

## 2022-06-06 NOTE — Progress Notes (Signed)
Subjective: (As above and below)     The patient/guardian gave verbal consent to treat.        Chief Complaint   Patient presents with    New Patient    URI     Started with sore throat, coughing, runny nose. At home covid test was negative on Thursday.      HPI  Alejandro Cohen is a 62 y.o. male who presents for evaluation of : nasal congestion, runny nose. Throat on fire pain. Symptom onset thursday . Preceding illness: none.  No other identified aggravating or alleviating factors. Symptoms are constant and overall unchanged. Denies: SOB, vomiting/diarrhea, rashes, fevers .          Review of Systems    Review of Systems - negative except as listed above    Reviewed PmHx, RxHx, FmHx, SocHx, AllgHx and updated in chart.  Family History   Problem Relation Age of Onset    Alcohol Abuse Father     Diabetes Father     Psychiatric Disorder Mother     Hypertension Mother     Heart Disease Mother     Migraines Mother     Elevated Lipids Mother     Diabetes Mother     Alcohol Abuse Paternal Grandfather     Alcohol Abuse Paternal Grandmother     Alcohol Abuse Maternal Grandfather     Alcohol Abuse Maternal Grandmother     Stroke Father     Hypertension Father     Heart Disease Father     Elevated Lipids Father     Stroke Mother        Past Medical History:   Diagnosis Date    Contact dermatitis and other eczema, due to unspecified cause     acne      Social History     Socioeconomic History    Marital status: Married     Spouse name: None    Number of children: None    Years of education: None    Highest education level: None   Tobacco Use    Smoking status: Former     Current packs/day: 0.00     Types: Cigarettes     Quit date: 10/09/1987     Years since quitting: 34.6    Smokeless tobacco: Never    Tobacco comments:     Quit smoking: smoked for 3 yrs of  and on   Substance and Sexual Activity    Alcohol use: No    Drug use: No          Current Outpatient Medications   Medication Sig    benzonatate (TESSALON) 200 MG  capsule Take 1 capsule by mouth 3 times daily as needed for Cough    tamsulosin (FLOMAX) 0.4 MG capsule Take 1 capsule by mouth daily    sildenafil (VIAGRA) 100 MG tablet Take 1 tablet by mouth daily as needed for Erectile Dysfunction     No current facility-administered medications for this visit.       Objective:     Vitals:    06/06/22 1000   BP: 119/78   Site: Left Upper Arm   Position: Sitting   Cuff Size: Large Adult   Pulse: 69   Temp: 97.7 F (36.5 C)   TempSrc: Oral   SpO2: 98%   Weight: 111.3 kg (245 lb 4.8 oz)   Height: 1.753 m (5' 9"$ )       Physical Exam   General appearance -  appears well hydrated and does not appear toxic, no acute distress  Eyes - EOMs intact. Non injected. No scleral icterus   Ears - no external swelling. TMs normal bilat.  Nose - nasal congestion and sniffling. No purulent drainage  Mouth - OP clear without swelling, exudate or lesion. Mucus membranes moist. Uvula midline.  Neck/Lymphatics - trachea midline, full AROM, no LAD of neck  Chest - Normal breathing effort no wheeze rales, rhonchi or diminishments bilaterally.  Heart - RRR, no murmurs  Skin - no observable rashes or pallor  Neurologic- alert and oriented x 3  Psychiatric- normal mood, behavior and though content.      Assessment/ Plan:     1. Viral URI with cough  -     AMB POC RAPID STREP A  -     benzonatate (TESSALON) 200 MG capsule; Take 1 capsule by mouth 3 times daily as needed for Cough, Disp-21 capsule, R-0Normal     Rapid strep negative  Start tessalon for cough relief.  No evidence suggesting complication of illness at this time. Will discharge home with close monitoring and follow up.  Supportive home care advised- maintain adequate fluid intake, over the counter Tylenol (for fever, aches, pains, chills), deep breathing exercises, nasal saline sprays for congestion, humidified air bedroom at night      Test Results:  Results for orders placed or performed in visit on 06/06/22   AMB POC RAPID STREP A   Result  Value Ref Range    Valid Internal Control, POC Y     Group A Strep Antigen, POC Negative        Follow up:  Follow up immediately for any new, worsening or changes or if symptoms are not improving over the next 7 days.         Koren Bound, APRN - NP

## 2022-09-17 ENCOUNTER — Ambulatory Visit: Payer: BLUE CROSS/BLUE SHIELD | Attending: Adult Health | Primary: Internal Medicine

## 2023-01-09 ENCOUNTER — Emergency Department: Admit: 2023-01-09 | Payer: BLUE CROSS/BLUE SHIELD | Primary: Internal Medicine

## 2023-01-09 ENCOUNTER — Ambulatory Visit: Admit: 2023-01-09 | Discharge: 2023-01-09 | Payer: BLUE CROSS/BLUE SHIELD | Primary: Internal Medicine

## 2023-01-09 ENCOUNTER — Inpatient Hospital Stay
Admit: 2023-01-09 | Discharge: 2023-01-09 | Disposition: A | Discharge status: Home / Self Care | Payer: BLUE CROSS/BLUE SHIELD | Attending: Emergency Medicine

## 2023-01-09 DIAGNOSIS — N200 Calculus of kidney: Secondary | ICD-10-CM

## 2023-01-09 DIAGNOSIS — R1031 Right lower quadrant pain: Secondary | ICD-10-CM

## 2023-01-09 LAB — CBC WITH AUTO DIFFERENTIAL
Basophils %: 1 % (ref 0–1)
Basophils Absolute: 0 10*3/uL (ref 0.0–0.1)
Eosinophils %: 2 % (ref 0–7)
Eosinophils Absolute: 0.1 10*3/uL (ref 0.0–0.4)
Hematocrit: 50.4 % — ABNORMAL HIGH (ref 36.6–50.3)
Hemoglobin: 16.8 g/dL (ref 12.1–17.0)
Immature Granulocytes %: 0 % (ref 0.0–0.5)
Immature Granulocytes Absolute: 0 10*3/uL (ref 0.00–0.04)
Lymphocytes %: 41 % (ref 12–49)
Lymphocytes Absolute: 2.4 10*3/uL (ref 0.8–3.5)
MCH: 30 pg (ref 26.0–34.0)
MCHC: 33.3 g/dL (ref 30.0–36.5)
MCV: 90 FL (ref 80.0–99.0)
MPV: 10.1 FL (ref 8.9–12.9)
Monocytes %: 9 % (ref 5–13)
Monocytes Absolute: 0.5 10*3/uL (ref 0.0–1.0)
Neutrophils %: 47 % (ref 32–75)
Neutrophils Absolute: 2.7 10*3/uL (ref 1.8–8.0)
Nucleated RBCs: 0 /100{WBCs}
Platelets: 202 10*3/uL (ref 150–400)
RBC: 5.6 M/uL (ref 4.10–5.70)
RDW: 12.9 % (ref 11.5–14.5)
WBC: 5.7 10*3/uL (ref 4.1–11.1)
nRBC: 0 10*3/uL (ref 0.00–0.01)

## 2023-01-09 LAB — URINALYSIS WITH REFLEX TO CULTURE
BACTERIA, URINE: NEGATIVE /HPF
Bilirubin, Urine: NEGATIVE
Blood, Urine: NEGATIVE
Glucose, Ur: NEGATIVE mg/dL
Ketones, Urine: NEGATIVE mg/dL
Leukocyte Esterase, Urine: NEGATIVE
Nitrite, Urine: NEGATIVE
Protein, UA: NEGATIVE mg/dL
Specific Gravity, UA: 1.025
Urobilinogen, Urine: 0.2 EU/dL (ref 0.2–1.0)
pH, Urine: 6 (ref 5.0–8.0)

## 2023-01-09 LAB — COMPREHENSIVE METABOLIC PANEL
ALT: 52 U/L (ref 12–78)
AST: 24 U/L (ref 15–37)
Albumin/Globulin Ratio: 1 — ABNORMAL LOW (ref 1.1–2.2)
Albumin: 4 g/dL (ref 3.5–5.0)
Alk Phosphatase: 69 U/L (ref 45–117)
Anion Gap: 4 mmol/L (ref 2–12)
BUN/Creatinine Ratio: 13 (ref 12–20)
BUN: 18 mg/dL (ref 6–20)
CO2: 29 mmol/L (ref 21–32)
Calcium: 9.7 mg/dL (ref 8.5–10.1)
Chloride: 106 mmol/L (ref 97–108)
Creatinine: 1.43 mg/dL — ABNORMAL HIGH (ref 0.70–1.30)
Est, Glom Filt Rate: 56 mL/min/{1.73_m2} — ABNORMAL LOW (ref >60)
Globulin: 3.9 g/dL (ref 2.0–4.0)
Glucose: 124 mg/dL — ABNORMAL HIGH (ref 65–100)
Potassium: 4.2 mmol/L (ref 3.5–5.1)
Sodium: 139 mmol/L (ref 136–145)
Total Bilirubin: 0.3 mg/dL (ref 0.2–1.0)
Total Protein: 7.9 g/dL (ref 6.4–8.2)

## 2023-01-09 LAB — EXTRA TUBES HOLD

## 2023-01-09 MED ORDER — ONDANSETRON 4 MG PO TBDP
4 MG | ORAL_TABLET | Freq: Three times a day (TID) | ORAL | 0 refills | Status: DC | PRN
Start: 2023-01-09 — End: 2023-06-15

## 2023-01-09 MED ORDER — FENTANYL CITRATE (PF) 100 MCG/2ML IJ SOLN
100 | INTRAMUSCULAR | Status: AC
Start: 2023-01-09 — End: 2023-01-09
  Administered 2023-01-09: 15:00:00 100 ug via INTRAVENOUS

## 2023-01-09 MED ORDER — OXYCODONE-ACETAMINOPHEN 5-325 MG PO TABS
5-325 | ORAL_TABLET | Freq: Four times a day (QID) | ORAL | 0 refills | Status: AC | PRN
Start: 2023-01-09 — End: 2023-01-12

## 2023-01-09 MED ORDER — ONDANSETRON HCL 4 MG/2ML IJ SOLN
4 | Freq: Once | INTRAMUSCULAR | Status: AC
Start: 2023-01-09 — End: 2023-01-09
  Administered 2023-01-09: 15:00:00 4 mg via INTRAVENOUS

## 2023-01-09 MED ORDER — KETOROLAC TROMETHAMINE 30 MG/ML IJ SOLN
30 | Freq: Once | INTRAMUSCULAR | Status: AC
Start: 2023-01-09 — End: 2023-01-09
  Administered 2023-01-09: 16:00:00 15 mg via INTRAVENOUS

## 2023-01-09 MED ORDER — SODIUM CHLORIDE 0.9 % IV BOLUS
0.9 | Freq: Once | INTRAVENOUS | Status: AC
Start: 2023-01-09 — End: 2023-01-09
  Administered 2023-01-09: 15:00:00 1000 mL via INTRAVENOUS

## 2023-01-09 MED FILL — KETOROLAC TROMETHAMINE 30 MG/ML IJ SOLN: 30 MG/ML | INTRAMUSCULAR | Qty: 1

## 2023-01-09 MED FILL — ONDANSETRON HCL 4 MG/2ML IJ SOLN: 4 MG/2ML | INTRAMUSCULAR | Qty: 2

## 2023-01-09 MED FILL — FENTANYL CITRATE (PF) 100 MCG/2ML IJ SOLN: 100 MCG/2ML | INTRAMUSCULAR | Qty: 2

## 2023-01-09 NOTE — ED Notes (Signed)
I have reviewed the provider's instructions with the patient, answering all questions to his satisfaction. Pt ambulatory to waiting room

## 2023-01-09 NOTE — Discharge Instructions (Addendum)
Schedule ibuprofen 6 mg 3 times a day for 3 days

## 2023-01-09 NOTE — ED Notes (Signed)
Pt oxygen started to intermittently desat to 88% post pain med administration. 2L NC applied, oxygen now 97%

## 2023-01-09 NOTE — Progress Notes (Signed)
Alejandro Cohen (DOB:  [birth year]) is a 62 y.o. male,Established patient, here for evaluation of the following chief complaint(s):  Abdominal Pain (Abdominal and right side pain. /X 1 hour. )      Assessment & Plan :  Visit Diagnoses and Associated Orders       Right lower quadrant pain    -  Primary                 Patient was seen today for severe right lower quadrant abdominal pain  He is acutely tender throughout his abdomen but especially so in the right lower quadrant  He is tachypneic and clearly uncomfortable in clinic, vital signs otherwise stable  I have significant concern for acute abdominal pathology like acute appendicitis  Please go immediately to the emergency room where you will likely have imaging and blood work to rule out emergent causes of your pain today       Subjective :    Abdominal Pain         62 y.o. male presents with symptoms of severe abdominal pain that came on about 1 hour ago.  States he had just finished having a bowel movement when pain began.  Denies any history of similar pain.  Reports severe 10 out of 10 abdominal pain which is sharp and stabbing.  The pain is worse in his right lower quadrant, but is present throughout his abdomen.  He denies fevers, chills or bodyaches.  States the bowel movement he had prior to the onset of his pain was normal, denies diarrhea, constipation or blood in the stool.  Denies history of kidney stones, no urinary symptoms.  No dysuria, hematuria or changes in urinary frequency.  He is not taking anything for his pain currently.         Vitals:    01/09/23 0955   BP: (!) 151/97   Site: Right Upper Arm   Position: Sitting   Cuff Size: Large Adult   Pulse: 67   Temp: 97.7 F (36.5 C)   SpO2: 98%   Weight: 116.6 kg (257 lb)       No results found for this visit on 01/09/23.      Objective   Physical Exam  Vitals and nursing note reviewed.   Constitutional:       General: He is not in acute distress.     Appearance: Normal appearance. He is not  ill-appearing.   HENT:      Head: Normocephalic and atraumatic.   Cardiovascular:      Rate and Rhythm: Normal rate and regular rhythm.      Pulses: Normal pulses.      Heart sounds: Normal heart sounds. No murmur heard.     No friction rub. No gallop.   Pulmonary:      Effort: Pulmonary effort is normal. Tachypnea present. No respiratory distress.      Breath sounds: Normal breath sounds. No wheezing, rhonchi or rales.   Abdominal:      General: There is distension.      Tenderness: There is generalized abdominal tenderness and tenderness in the right lower quadrant. There is guarding and rebound. Positive signs include McBurney's sign.      Comments: Significant abdominal tenderness and guarding noted throughout abdomen, pain is most severe in the right lower quadrant   Skin:     General: Skin is warm and dry.   Neurological:      General: No focal deficit present.  Mental Status: He is alert and oriented to person, place, and time.               An electronic signature was used to authenticate this note.    Judie Bonus, APRN - NP

## 2023-01-09 NOTE — ED Notes (Signed)
Pt tolerating RA, now 97% on RA

## 2023-01-09 NOTE — ED Provider Notes (Signed)
[facility] EMERGENCY DEPT  EMERGENCY DEPARTMENT ENCOUNTER       Pt Name: Alejandro Cohen  MRN: [id number]  Birthdate 24-Mar-1961  Date of evaluation: 01/09/2023  Provider: Evette Doffing, DO   PCP: Bronson Ing., MD  Note Started: 10:52 AM EDT 01/09/23     CHIEF COMPLAINT       Chief Complaint   Patient presents with    Abdominal Pain     Pt presents to ER from [facility] as referred for ABD pain and suspected ruptured appendix. Pt reports sudden onset RLQ ABD pain onset x2 hours ago. Pt is very anxious and unable to sit still due to pain in triage         HISTORY OF PRESENT ILLNESS: 1 or more elements      History From: Patient, History limited by: none     Alejandro Cohen is a 62 y.o. male presents to the emergency department for evaluation of right lower quadrant abdominal pain.       Please See MDM for Additional Details of the HPI/PMH  Nursing Notes were all reviewed and agreed with or any disagreements were addressed in the HPI.     REVIEW OF SYSTEMS        Positives and Pertinent negatives as per HPI.    PAST HISTORY     Past Medical History:  Past Medical History:   Diagnosis Date    Contact dermatitis and other eczema, due to unspecified cause     acne       Past Surgical History:  No past surgical history on file.    Family History:  Family History   Problem Relation Age of Onset    Alcohol Abuse Father     Diabetes Father     Psychiatric Disorder Mother     Hypertension Mother     Heart Disease Mother     Migraines Mother     Elevated Lipids Mother     Diabetes Mother     Alcohol Abuse Paternal Grandfather     Alcohol Abuse Paternal Grandmother     Alcohol Abuse Maternal Grandfather     Alcohol Abuse Maternal Grandmother     Stroke Father     Hypertension Father     Heart Disease Father     Elevated Lipids Father     Stroke Mother        Social History:  Social History     Tobacco Use    Smoking status: Former     Current packs/day: 0.00     Types: Cigarettes     Quit date: 10/09/1987     Years since quitting: 35.2     Smokeless tobacco: Never    Tobacco comments:     Quit smoking: smoked for 3 yrs of  and on   Substance Use Topics    Alcohol use: No    Drug use: No       Allergies:  No Known Allergies    CURRENT MEDICATIONS      Previous Medications    SILDENAFIL (VIAGRA) 100 MG TABLET    Take 1 tablet by mouth daily as needed for Erectile Dysfunction    TAMSULOSIN (FLOMAX) 0.4 MG CAPSULE    Take 1 capsule by mouth daily       SCREENINGS               No data recorded         PHYSICAL EXAM  ED Triage Vitals [01/09/23 1042]   Encounter Vitals Group      BP (!) 143/122      Systolic BP Percentile       Diastolic BP Percentile       Pulse 70      Respirations 20      Temp       Temp src       SpO2 100 %      Weight       Height       Head Circumference       Peak Flow       Pain Score       Pain Loc       Pain Education       Exclude from Growth Chart         Physical Exam  Vitals and nursing note reviewed.   Constitutional:       General: He is not in acute distress.     Appearance: He is well-developed. He is obese. He is not ill-appearing.      Comments: Looks uncomfortable   HENT:      Head: Normocephalic and atraumatic.      Mouth/Throat:      Mouth: Mucous membranes are moist.   Eyes:      General: No scleral icterus.     Extraocular Movements: Extraocular movements intact.      Pupils: Pupils are equal, round, and reactive to light.   Cardiovascular:      Rate and Rhythm: Normal rate and regular rhythm.   Pulmonary:      Effort: Pulmonary effort is normal. No respiratory distress.      Breath sounds: Normal breath sounds. No wheezing, rhonchi or rales.   Chest:      Chest wall: No tenderness.   Abdominal:      General: There is no distension.      Palpations: Abdomen is soft.      Tenderness: There is generalized abdominal tenderness and tenderness in the right lower quadrant.   Musculoskeletal:         General: Normal range of motion.      Cervical back: Normal range of motion and neck supple.      Right lower leg: No  edema.      Left lower leg: No edema.   Skin:     General: Skin is warm and dry.      Capillary Refill: Capillary refill takes less than 2 seconds.   Neurological:      Mental Status: He is alert and oriented to person, place, and time.      Cranial Nerves: No cranial nerve deficit.   Psychiatric:         Mood and Affect: Mood normal.         Behavior: Behavior normal.          DIAGNOSTIC RESULTS   LABS:     Recent Results (from the past 24 hour(s))   CBC with Auto Differential    Collection Time: 01/09/23 10:50 AM   Result Value Ref Range    WBC 5.7 4.1 - 11.1 K/uL    RBC 5.60 4.10 - 5.70 M/uL    Hemoglobin 16.8 12.1 - 17.0 g/dL    Hematocrit 96.0 (H) 36.6 - 50.3 %    MCV 90.0 80.0 - 99.0 FL    MCH 30.0 26.0 - 34.0 PG    MCHC 33.3 30.0 - 36.5 g/dL  RDW 12.9 11.5 - 14.5 %    Platelets 202 150 - 400 K/uL    MPV 10.1 8.9 - 12.9 FL    Nucleated RBCs 0.0 0 PER 100 WBC    nRBC 0.00 0.00 - 0.01 K/uL    Neutrophils % 47 32 - 75 %    Lymphocytes % 41 12 - 49 %    Monocytes % 9 5 - 13 %    Eosinophils % 2 0 - 7 %    Basophils % 1 0 - 1 %    Immature Granulocytes % 0 0.0 - 0.5 %    Neutrophils Absolute 2.7 1.8 - 8.0 K/UL    Lymphocytes Absolute 2.4 0.8 - 3.5 K/UL    Monocytes Absolute 0.5 0.0 - 1.0 K/UL    Eosinophils Absolute 0.1 0.0 - 0.4 K/UL    Basophils Absolute 0.0 0.0 - 0.1 K/UL    Immature Granulocytes Absolute 0.0 0.00 - 0.04 K/UL    Differential Type AUTOMATED     Comprehensive Metabolic Panel    Collection Time: 01/09/23 10:50 AM   Result Value Ref Range    Sodium 139 136 - 145 mmol/L    Potassium 4.2 3.5 - 5.1 mmol/L    Chloride 106 97 - 108 mmol/L    CO2 29 21 - 32 mmol/L    Anion Gap 4 2 - 12 mmol/L    Glucose 124 (H) 65 - 100 mg/dL    BUN 18 6 - 20 MG/DL    Creatinine 1.61 (H) 0.70 - 1.30 MG/DL    BUN/Creatinine Ratio 13 12 - 20      Est, Glom Filt Rate 56 (L) >60 ml/min/1.68m2    Calcium 9.7 8.5 - 10.1 MG/DL    Total Bilirubin 0.3 0.2 - 1.0 MG/DL    ALT 52 12 - 78 U/L    AST 24 15 - 37 U/L    Alk Phosphatase  69 45 - 117 U/L    Total Protein 7.9 6.4 - 8.2 g/dL    Albumin 4.0 3.5 - 5.0 g/dL    Globulin 3.9 2.0 - 4.0 g/dL    Albumin/Globulin Ratio 1.0 (L) 1.1 - 2.2     Extra Tubes Hold    Collection Time: 01/09/23 10:50 AM   Result Value Ref Range    Specimen HOld blue, pst     Comment:        Add-on orders for these samples will be processed based on acceptable specimen integrity and analyte stability, which may vary by analyte.   Urinalysis with Reflex to Culture    Collection Time: 01/09/23 11:53 AM    Specimen: Urine   Result Value Ref Range    Color, UA YELLOW/STRAW      Appearance CLEAR CLEAR      Specific Gravity, UA 1.025      pH, Urine 6.0 5.0 - 8.0      Protein, UA Negative NEG mg/dL    Glucose, Ur Negative NEG mg/dL    Ketones, Urine Negative NEG mg/dL    Bilirubin, Urine Negative NEG      Blood, Urine Negative NEG      Urobilinogen, Urine 0.2 0.2 - 1.0 EU/dL    Nitrite, Urine Negative NEG      Leukocyte Esterase, Urine Negative NEG      WBC, UA 0-4 0 - 4 /hpf    RBC, UA 0-5 0 - 5 /hpf    Epithelial Cells, UA FEW FEW /lpf  BACTERIA, URINE Negative NEG /hpf    Urine Culture if Indicated CULTURE NOT INDICATED BY UA RESULT CNI      Hyaline Casts, UA 0-2 0 - 2 /lpf       EKG: If performed, independent interpretation documented below in the MDM section     RADIOLOGY:  Non-plain film images such as CT, Ultrasound and MRI are read by the radiologist. Plain radiographic images are visualized and preliminarily interpreted by the ED Provider with the findings documented in the MDM section.     Interpretation per the Radiologist below, if available at the time of this note:     CT ABDOMEN PELVIS WO CONTRAST Additional Contrast? None   Final Result      1. There is a 2 mm stone of the distal right ureter just above the UVJ. No   hydronephrosis.      2. Normal appendix.      Electronically signed by Renaldo Fiddler           PROCEDURES   Unless otherwise noted below, none  Procedures     CRITICAL CARE TIME   None    EMERGENCY  DEPARTMENT COURSE and DIFFERENTIAL DIAGNOSIS/MDM   Vitals:    Vitals:    01/09/23 1042   BP: (!) 143/122   Pulse: 70   Resp: 20   SpO2: 100%        Patient was given the following medications:  Medications   fentaNYL (SUBLIMAZE) injection 100 mcg (has no administration in time range)   ondansetron (ZOFRAN) injection 4 mg (has no administration in time range)   sodium chloride 0.9 % bolus 1,000 mL (has no administration in time range)       Medical Decision Making  Amount and/or Complexity of Data Reviewed  Labs: ordered.  Radiology: ordered.    Risk  Prescription drug management.      Patient referred to the emergency department from urgent care secondary right lower quadrant pain.  Patient states had a sudden onset of severe pain located in the right lower quadrant this morning.  He states is described as a constant dull ache rated 10 out of 10.  He states he has had nausea but no vomiting.  He denies any recent diarrhea or constipation.  He denies any fever or chills.  He denies any alleviating exacerbating factors.      Patient noted to not really be able to get into any position of comfort.  Based off of sudden onset and severity of pain would suspect more likely to be possible kidney stone.  Will start with CT abdomen pelvis without contrast.  Patiently medicated for pain.  Will obtain baseline blood work to include CBC and CMP will also send off urinalysis.        Labs reassuring urinalysis unremarkable.  CT demonstrates a 2 mm kidney stone in the distal UVJ.  Patient's pain well-controlled during his ED stay.  Patient be discharged home pain control follow-up with [place] [facility].  FINAL IMPRESSION     1. Kidney stone          DISPOSITION/PLAN   Alejandro Cohen's  results have been reviewed with him.  He has been counseled regarding his diagnosis, treatment, and plan.  He verbally conveys understanding and agreement of the signs, symptoms, diagnosis, treatment and prognosis and additionally agrees to follow  up as discussed.  He also agrees with the care-plan and conveys that all of his questions have been answered.  I  have also provided discharge instructions for him that include: educational information regarding their diagnosis and treatment, and list of reasons why they would want to return to the ED prior to their follow-up appointment, should his condition change.     CLINICAL IMPRESSION    Discharge Note: The patient is stable for discharge home. The signs, symptoms, diagnosis, and discharge instructions have been discussed, understanding conveyed, and agreed upon. The patient is to follow up as recommended or return to ER should their symptoms worsen.      PATIENT REFERRED TO:  Jan Fireman, MD  [address]  [place] [address]  [phone]      Kidney STone Hotline 24h [id number]       DISCHARGE MEDICATIONS:     Medication List        START taking these medications      ondansetron 4 MG disintegrating tablet  Commonly known as: ZOFRAN-ODT  Take 1 tablet by mouth 3 times daily as needed for Nausea or Vomiting     oxyCODONE-acetaminophen 5-325 MG per tablet  Commonly known as: Percocet  Take 1 tablet by mouth every 6 hours as needed for Pain for up to 3 days. Intended supply: 3 days. Take lowest dose possible to manage pain Max Daily Amount: 4 tablets            ASK your doctor about these medications      sildenafil 100 MG tablet  Commonly known as: VIAGRA  Take 1 tablet by mouth daily as needed for Erectile Dysfunction     tamsulosin 0.4 MG capsule  Commonly known as: FLOMAX  Take 1 capsule by mouth daily               Where to Get Your Medications        These medications were sent to [id number], [place] - [address] - P [phone] - [phone]  [address], [place] [address]      Phone: [phone]   ondansetron 4 MG disintegrating tablet  oxyCODONE-acetaminophen 5-325 MG per tablet           DISCONTINUED MEDICATIONS:  Current Discharge Medication List           I am the Primary Clinician of Record.   Evette Doffing, DO (electronically signed)    (Please note that parts of this dictation were completed with voice recognition software. Quite often unanticipated grammatical, syntax, homophones, and other interpretive errors are inadvertently transcribed by the computer software. Please disregards these errors. Please excuse any errors that have escaped final proofreading.)          Tonie Griffith, DO  01/09/23 1347

## 2023-01-09 NOTE — ED Notes (Signed)
Abdominal Pain (Pt presents to ER from [facility] as referred for ABD pain and suspected ruptured appendix. Pt reports sudden onset RLQ ABD pain onset x2 hours ago. Pt is very anxious and unable to sit still due to pain in triage     Pt off the floor to CT

## 2023-01-13 MED ORDER — TAMSULOSIN HCL 0.4 MG PO CAPS
0.4 | ORAL_CAPSULE | Freq: Every day | ORAL | 3 refills | Status: DC
Start: 2023-01-13 — End: 2024-03-08

## 2023-01-17 ENCOUNTER — Ambulatory Visit: Admit: 2023-01-17 | Payer: BLUE CROSS/BLUE SHIELD | Attending: Internal Medicine | Primary: Internal Medicine

## 2023-01-17 VITALS — BP 120/74 | HR 73 | Temp 98.00000°F | Resp 16 | Ht 69.0 in | Wt 255.0 lb

## 2023-01-17 DIAGNOSIS — N200 Calculus of kidney: Secondary | ICD-10-CM

## 2023-01-17 NOTE — Progress Notes (Signed)
 Alejandro Cohen is a 62 y.o. male and presents with Follow-up (Kidney stones)  .  Subjective:    He states he has a renal stone that has passed on the rt.    Erectile dysfunction Review::  Patient complains of difficulty in maintaining an adequate erection.  He states that his present medication is helping thus far.    He reports numbness and tingling in the hands    Review of Systems  Constitutional: negative for fevers, chills, anorexia and weight loss  Eyes:   negative for visual disturbance and irritation  ENT:   negative for tinnitus,sore throat,nasal congestion,ear pains.hoarseness  Respiratory:  negative for cough, hemoptysis, dyspnea,wheezing  CV:   negative for chest pain, palpitations, lower extremity edema  GI:   negative for nausea, vomiting, diarrhea, abdominal pain,melena  Endo:               negative for polyuria,polydipsia,polyphagia,heat intolerance  Genitourinary: negative for frequency, dysuria and hematuria  Integument:  negative for rash and pruritus  Hematologic:  negative for easy bruising and gum/nose bleeding  Musculoskel: negative for myalgias, arthralgias, back pain, muscle weakness, joint pain  Neurological:  negative for headaches, dizziness, vertigo, memory problems and gait   Behavl/Psych: negative for feelings of anxiety, depression, mood changes    Past Medical History:   Diagnosis Date    Contact dermatitis and other eczema, due to unspecified cause     acne     No past surgical history on file.  Social History     Socioeconomic History    Marital status: Married     Spouse name: None    Number of children: None    Years of education: None    Highest education level: None   Tobacco Use    Smoking status: Former     Current packs/day: 0.00     Types: Cigarettes     Quit date: 10/09/1987     Years since quitting: 35.2    Smokeless tobacco: Never    Tobacco comments:     Quit smoking: smoked for 3 yrs of  and on   Substance and Sexual Activity    Alcohol use: No    Drug use: No      Social Determinants of Health     Housing Stability: Unknown (01/17/2023)    Housing Stability Vital Sign     Homeless in the Last Year: Patient declined     Family History   Problem Relation Age of Onset    Alcohol Abuse Father     Diabetes Father     Psychiatric Disorder Mother     Hypertension Mother     Heart Disease Mother     Migraines Mother     Elevated Lipids Mother     Diabetes Mother     Alcohol Abuse Paternal Grandfather     Alcohol Abuse Paternal Grandmother     Alcohol Abuse Maternal Grandfather     Alcohol Abuse Maternal Grandmother     Stroke Father     Hypertension Father     Heart Disease Father     Elevated Lipids Father     Stroke Mother      Current Outpatient Medications   Medication Sig Dispense Refill    tamsulosin  (FLOMAX ) 0.4 MG capsule TAKE 1 CAPSULE BY MOUTH DAILY 90 capsule 3    sildenafil  (VIAGRA ) 100 MG tablet Take 1 tablet by mouth daily as needed for Erectile Dysfunction 30 tablet 1  ondansetron  (ZOFRAN -ODT) 4 MG disintegrating tablet Take 1 tablet by mouth 3 times daily as needed for Nausea or Vomiting (Patient not taking: Reported on 01/17/2023) 21 tablet 0     No current facility-administered medications for this visit.     No Known Allergies    Objective:  BP 120/74   Pulse 73   Temp 98 F (36.7 C) (Oral)   Resp 16   Ht 1.753 m (5' 9)   Wt 115.7 kg (255 lb)   SpO2 97%   BMI 37.66 kg/m   Physical Exam:   General appearance - alert, well appearing, and in no distress  Mental status - alert, oriented to person, place, and time  EYE-PERLA, EOMI, corneas normal, no foreign bodies  ENT-ENT exam normal, no neck nodes or sinus tenderness  Nose - normal and patent, no erythema, discharge or polyps  Mouth - mucous membranes moist, pharynx normal without lesions  Neck - supple, no significant adenopathy   Chest - clear to auscultation, no wheezes, rales or rhonchi, symmetric air entry   Heart - normal rate, regular rhythm, normal S1, S2, no murmurs, rubs, clicks or gallops    Abdomen - soft, nontender, nondistended, no masses or organomegaly  Lymph- no adenopathy palpable  Ext-peripheral pulses normal, no pedal edema, no clubbing or cyanosis  Skin-Warm and dry. no hyperpigmentation, vitiligo, or suspicious lesions  Neuro -alert, oriented, normal speech, no focal findings or movement disorder noted  Neck-normal C-spine, no tenderness, full ROM without pain  Feet-no nail deformities or callus formation with good pulses noted      No results found for this visit on 01/17/23.    Assessment/Plan:    ICD-10-CM    1. Renal stone  N20.0 Amb External Referral To Urology      2. Bilateral carpal tunnel syndrome  G56.03         Orders Placed This Encounter   Procedures    Amb External Referral To Urology     Referral Priority:   Routine     Referral Type:   Eval and Treat     Referral Reason:   Specialty Services Required     Referred to Provider:   Maranda Lamar DASEN, MD     Requested Specialty:   Urology     Number of Visits Requested:   1     lose weight, call if any problems,  There are no Patient Instructions on file for this visit.   Follow-up and Dispositions    Return in about 2 months (around 03/19/2023).           I have reviewed with the patient details of the assessment and plan and all questions were answered. Relevent patient education was performed.The most recent lab findings were reviewed with the patient.    An After Visit Summary was printed and given to the patient.

## 2023-01-17 NOTE — Progress Notes (Signed)
 1. Have you been to the ER, urgent care clinic since your last visit?  Hospitalized since your last visit?Yes Where: [facility] /Kidney stones    2. Have you seen or consulted any other health care providers outside of the [facility] since your last visit?  Include any pap smears or colon screening. No     Chief Complaint   Patient presents with    Follow-up     Kidney stones         PHQ-9 Total Score: 0 (01/17/2023 11:00 AM)

## 2023-03-15 ENCOUNTER — Inpatient Hospital Stay
Admit: 2023-03-15 | Discharge: 2023-03-15 | Disposition: A | Discharge status: Home / Self Care | Payer: BLUE CROSS/BLUE SHIELD | Admitting: Emergency Medicine

## 2023-03-15 ENCOUNTER — Emergency Department: Admit: 2023-03-15 | Payer: BLUE CROSS/BLUE SHIELD | Primary: Internal Medicine

## 2023-03-15 DIAGNOSIS — J069 Acute upper respiratory infection, unspecified: Secondary | ICD-10-CM

## 2023-03-15 LAB — CBC WITH AUTO DIFFERENTIAL
Basophils %: 1 % (ref 0–1)
Basophils Absolute: 0 10*3/uL (ref 0.0–0.1)
Eosinophils %: 2 % (ref 0–7)
Eosinophils Absolute: 0.1 10*3/uL (ref 0.0–0.4)
Hematocrit: 47.5 % (ref 36.6–50.3)
Hemoglobin: 16.1 g/dL (ref 12.1–17.0)
Immature Granulocytes %: 0 % (ref 0.0–0.5)
Immature Granulocytes Absolute: 0 10*3/uL (ref 0.00–0.04)
Lymphocytes %: 34 % (ref 12–49)
Lymphocytes Absolute: 1.8 10*3/uL (ref 0.8–3.5)
MCH: 30.3 pg (ref 26.0–34.0)
MCHC: 33.9 g/dL (ref 30.0–36.5)
MCV: 89.5 fL (ref 80.0–99.0)
MPV: 10.2 fL (ref 8.9–12.9)
Monocytes %: 11 % (ref 5–13)
Monocytes Absolute: 0.6 10*3/uL (ref 0.0–1.0)
Neutrophils %: 52 % (ref 32–75)
Neutrophils Absolute: 2.8 10*3/uL (ref 1.8–8.0)
Nucleated RBCs: 0 /100{WBCs}
Platelets: 191 10*3/uL (ref 150–400)
RBC: 5.31 M/uL (ref 4.10–5.70)
RDW: 13.1 % (ref 11.5–14.5)
WBC: 5.4 10*3/uL (ref 4.1–11.1)
nRBC: 0 10*3/uL (ref 0.00–0.01)

## 2023-03-15 LAB — COMPREHENSIVE METABOLIC PANEL
ALT: 53 U/L (ref 12–78)
AST: 22 U/L (ref 15–37)
Albumin/Globulin Ratio: 1.1 (ref 1.1–2.2)
Albumin: 3.8 g/dL (ref 3.5–5.0)
Alk Phosphatase: 63 U/L (ref 45–117)
Anion Gap: 2 mmol/L (ref 2–12)
BUN/Creatinine Ratio: 16 (ref 12–20)
BUN: 20 mg/dL (ref 6–20)
CO2: 30 mmol/L (ref 21–32)
Calcium: 9.5 mg/dL (ref 8.5–10.1)
Chloride: 106 mmol/L (ref 97–108)
Creatinine: 1.23 mg/dL (ref 0.70–1.30)
Est, Glom Filt Rate: 67 mL/min/{1.73_m2} (ref >60)
Globulin: 3.4 g/dL (ref 2.0–4.0)
Glucose: 111 mg/dL — ABNORMAL HIGH (ref 65–100)
Potassium: 4 mmol/L (ref 3.5–5.1)
Sodium: 138 mmol/L (ref 136–145)
Total Bilirubin: 0.3 mg/dL (ref 0.2–1.0)
Total Protein: 7.2 g/dL (ref 6.4–8.2)

## 2023-03-15 LAB — TROPONIN: Troponin, High Sensitivity: 5 ng/L (ref 0–76)

## 2023-03-15 LAB — MAGNESIUM: Magnesium: 2.4 mg/dL (ref 1.6–2.4)

## 2023-03-15 MED ORDER — IBUPROFEN 600 MG PO TABS
600 MG | ORAL_TABLET | Freq: Four times a day (QID) | ORAL | 0 refills | Status: DC | PRN
Start: 2023-03-15 — End: 2023-06-15

## 2023-03-15 NOTE — ED Provider Notes (Signed)
 [facility] EM[facility] DEPT  EMERGENCY DEPARTMENT ENCOUNTER    Patient Name: Alejandro Cohen  MRN: [id number]  Birth Date: 1961-02-13  PCP: Bronson Ing., MD    Time/Date of evaluation: 3:52 PM EST on 03/15/23    History of Presenting Illness     Chief Comp

## 2023-03-16 LAB — EKG 12-LEAD
Atrial Rate: 69 {beats}/min
Diagnosis: NORMAL
P Axis: 63 degrees
P-R Interval: 184 ms
Q-T Interval: 398 ms
QRS Duration: 104 ms
QTc Calculation (Bazett): 426 ms
R Axis: 82 degrees
T Axis: 44 degrees
Ventricular Rate: 69 {beats}/min

## 2023-03-16 NOTE — Care Coordination-Inpatient (Signed)
 Ambulatory Care Coordination Note     03/16/2023 9:41 AM     Patient Current Location:  [place]     This patient was received as a referral from [facility] health report .    ACM contacted the patient by telephone. Verified name and DOB with patient as id

## 2023-06-15 ENCOUNTER — Ambulatory Visit
Admit: 2023-06-15 | Discharge: 2023-06-15 | Payer: BLUE CROSS/BLUE SHIELD | Attending: Internal Medicine | Primary: Internal Medicine

## 2023-06-15 VITALS — BP 110/74 | HR 72 | Temp 98.00000°F | Resp 16 | Ht 69.0 in | Wt 257.0 lb

## 2023-06-15 DIAGNOSIS — G5603 Carpal tunnel syndrome, bilateral upper limbs: Secondary | ICD-10-CM

## 2023-06-15 MED ORDER — SILDENAFIL CITRATE 100 MG PO TABS
100 | ORAL_TABLET | Freq: Every day | ORAL | 1 refills | Status: AC | PRN
Start: 2023-06-15 — End: ?

## 2023-06-15 NOTE — Progress Notes (Signed)
 Alejandro Cohen is a 63 y.o. male and presents with Pre-op Exam and Carpal Tunnel (/)  .  Subjective:    Carpal Tunnel Syndrome Review:  Patient presents for evaluation of pain in both hand(s), hand paresthesias of the first three fingers.  Onset of the symptoms was years ago. Current symptoms include tingling and weakness involving the medial aspect of the bilateral hand(s): severity: moderate.  Patient's overall course: gradually worsening. Patient has had no prior elbow problems. Previous visits for this problem: none.  Evaluation to date: none.  Treatment to date: medication,he was told he needed surgery  [id number] IMPRESSION:   1. Bilateral moderate carpal tunnel syndrome with severe sensory and   moderate motor fiber involvement.     Erectile dysfunction Review::  Patient complains of difficulty in maintaining an adequate erection.  He states that his present medication is helping thus far.    BPH Review:  He has no burning on urination,dysuria or dribbling reported.He continues to report frequency on urination on occasion.      Review of Systems  Constitutional: negative for fevers, chills, anorexia and weight loss  Eyes:   negative for visual disturbance and irritation  ENT:   negative for tinnitus,sore throat,nasal congestion,ear pains.hoarseness  Respiratory:  negative for cough, hemoptysis, dyspnea,wheezing  CV:   negative for chest pain, palpitations, lower extremity edema  GI:   negative for nausea, vomiting, diarrhea, abdominal pain,melena  Endo:               negative for polyuria,polydipsia,polyphagia,heat intolerance  Genitourinary: frequency,  Integument:  negative for rash and pruritus  Hematologic:  negative for easy bruising and gum/nose bleeding  Musculoskel: muscle weakness,  Neurological:  negative for headaches, dizziness, vertigo, memory problems and gait   Behavl/Psych: negative for feelings of anxiety, depression, mood changes    Past Medical History:   Diagnosis Date     Contact dermatitis and other eczema, due to unspecified cause     acne     No past surgical history on file.  Social History     Socioeconomic History    Marital status: Married     Spouse name: None    Number of children: None    Years of education: None    Highest education level: None   Tobacco Use    Smoking status: Former     Current packs/day: 0.00     Types: Cigarettes     Quit date: 10/09/1987     Years since quitting: 35.7    Smokeless tobacco: Never    Tobacco comments:     Quit smoking: smoked for 3 yrs of  and on   Substance and Sexual Activity    Alcohol use: No    Drug use: No     Social Determinants of Health     Food Insecurity: No Food Insecurity (06/15/2023)    Hunger Vital Sign     Worried About Running Out of Food in the Last Year: Never true     Ran Out of Food in the Last Year: Never true   Transportation Needs: No Transportation Needs (06/15/2023)    PRAPARE - Transportation     Lack of Transportation (Medical): No     Lack of Transportation (Non-Medical): No   Housing Stability: Low Risk  (06/15/2023)    Housing Stability Vital Sign     Unable to Pay for Housing in the Last Year: No     Number of Times Moved in the Last  Year: 0     Homeless in the Last Year: No     Family History   Problem Relation Age of Onset    Alcohol Abuse Father     Diabetes Father     Psychiatric Disorder Mother     Hypertension Mother     Heart Disease Mother     Migraines Mother     Elevated Lipids Mother     Diabetes Mother     Alcohol Abuse Paternal Grandfather     Alcohol Abuse Paternal Grandmother     Alcohol Abuse Maternal Grandfather     Alcohol Abuse Maternal Grandmother     Stroke Father     Hypertension Father     Heart Disease Father     Elevated Lipids Father     Stroke Mother      Current Outpatient Medications   Medication Sig Dispense Refill    sildenafil (VIAGRA) 100 MG tablet Take 1 tablet by mouth daily as needed for Erectile Dysfunction 30 tablet 1    tamsulosin (FLOMAX) 0.4 MG capsule TAKE 1 CAPSULE BY  MOUTH DAILY 90 capsule 3     No current facility-administered medications for this visit.     No Known Allergies    Objective:  BP 110/74   Pulse 72   Temp 98 F (36.7 C) (Oral)   Resp 16   Ht 1.753 m (5\' 9" )   Wt 116.6 kg (257 lb)   SpO2 95%   BMI 37.95 kg/m   Physical Exam:   General appearance - alert, well appearing, and in no distress  Mental status - alert, oriented to person, place, and time  EYE-PERLA, EOMI, corneas normal, no foreign bodies  ENT-ENT exam normal, no neck nodes or sinus tenderness  Nose - normal and patent, no erythema, discharge or polyps  Mouth - mucous membranes moist, pharynx normal without lesions  Neck - supple, no significant adenopathy   Chest - clear to auscultation, no wheezes, rales or rhonchi, symmetric air entry   Heart - normal rate, regular rhythm, normal S1, S2, no murmurs, rubs, clicks or gallops   Abdomen - soft, nontender, nondistended, no masses or organomegaly  Lymph- no adenopathy palpable  Ext-peripheral pulses normal, no pedal edema, no clubbing or cyanosis  Skin-Warm and dry. no hyperpigmentation, vitiligo, or suspicious lesions  Neuro -alert, oriented, normal speech, no focal findings or movement disorder noted  Neck-normal C-spine, no tenderness, full ROM without pain  Feet-no nail deformities or callus formation with good pulses noted  Tenderness both hands    No results found for this visit on 06/15/23.    Assessment/Plan:    ICD-10-CM    1. Bilateral carpal tunnel syndrome  G56.03 Comprehensive Metabolic Panel      2. Benign prostatic hyperplasia with incomplete bladder emptying  N40.1 Comprehensive Metabolic Panel    R39.14       3. Erectile disorder  N52.9 Comprehensive Metabolic Panel        Orders Placed This Encounter   Procedures    Comprehensive Metabolic Panel     Standing Status:   Future     Standing Expiration Date:   06/14/2024     Medically stable for surgery  at this time,  There are no Patient Instructions on file for this visit.         I  have reviewed with the patient details of the assessment and plan and all questions were answered. Relevent patient education was performed.The most recent  lab findings were reviewed with the patient.    An After Visit Summary was printed and given to the patient.

## 2023-06-15 NOTE — Progress Notes (Signed)
 1. Have you been to the ER, urgent care clinic since your last visit?  Hospitalized since your last visit?No    2. Have you seen or consulted any other health care providers outside of the [facility] since your last visit?  Include any pap smears or colon screening. No     Chief Complaint   Patient presents with    Pre-op Exam           PHQ-9 Total Score: 0 (06/15/2023  7:48 AM)

## 2023-06-17 LAB — COMPREHENSIVE METABOLIC PANEL
ALT: 50 U/L (ref 12–78)
AST: 29 U/L (ref 15–37)
Albumin/Globulin Ratio: 1.2 (ref 1.1–2.2)
Albumin: 4.1 g/dL (ref 3.5–5.0)
Alk Phosphatase: 64 U/L (ref 45–117)
Anion Gap: 5 mmol/L (ref 2–12)
BUN/Creatinine Ratio: 14 (ref 12–20)
BUN: 15 mg/dL (ref 6–20)
CO2: 28 mmol/L (ref 21–32)
Calcium: 9.8 mg/dL (ref 8.5–10.1)
Chloride: 103 mmol/L (ref 97–108)
Creatinine: 1.09 mg/dL (ref 0.70–1.30)
Est, Glom Filt Rate: 78 mL/min/{1.73_m2} (ref >60)
Globulin: 3.3 g/dL (ref 2.0–4.0)
Glucose: 104 mg/dL — ABNORMAL HIGH (ref 65–100)
Potassium: 4.2 mmol/L (ref 3.5–5.1)
Sodium: 136 mmol/L (ref 136–145)
Total Bilirubin: 0.4 mg/dL (ref 0.2–1.0)
Total Protein: 7.4 g/dL (ref 6.4–8.2)

## 2023-09-15 ENCOUNTER — Ambulatory Visit
Admit: 2023-09-15 | Discharge: 2023-09-15 | Payer: BLUE CROSS/BLUE SHIELD | Attending: Internal Medicine | Primary: Internal Medicine

## 2023-09-15 VITALS — BP 110/74 | HR 78 | Temp 98.00000°F | Resp 16 | Ht 69.0 in | Wt 256.0 lb

## 2023-09-15 DIAGNOSIS — Z6837 Body mass index (BMI) 37.0-37.9, adult: Secondary | ICD-10-CM

## 2023-09-15 MED ORDER — SEMAGLUTIDE-WEIGHT MANAGEMENT 0.25 MG/0.5ML SC SOAJ
0.25 | SUBCUTANEOUS | 0 refills | 30.00000 days | Status: DC
Start: 2023-09-15 — End: 2023-09-15

## 2023-09-15 MED ORDER — SILDENAFIL CITRATE 100 MG PO TABS
100 | ORAL_TABLET | Freq: Every day | ORAL | 1 refills | 10.00000 days | Status: AC | PRN
Start: 2023-09-15 — End: ?

## 2023-09-15 MED ORDER — TIRZEPATIDE-WEIGHT MANAGEMENT 2.5 MG/0.5ML SC SOAJ
2.5 | SUBCUTANEOUS | 0 refills | 28.00000 days | Status: AC
Start: 2023-09-15 — End: ?

## 2023-09-15 NOTE — Telephone Encounter (Signed)
 Patient called and states the insurance carrier told him a prior [employer] is needed for the zepbound.  He was given a key-code which is [id number].  He was informed that there is about a week turn around for the prior auth.

## 2023-09-15 NOTE — Progress Notes (Signed)
 Alejandro Cohen is a 63 y.o. male and presents with Gastroesophageal Reflux and Hypertension  .  Subjective:    He has been concerned about his weight  He states he has been on multiple diets without relief.  He has also been on  a  fitness program with out success.      He states at one point he has been on steroids for carpal tunnel and feels the steroids added to his weight gain    BPH Review:  He has no burning on urination,dysuria or dribbling reported.He has no frequency on urination.  He has been on Flomax  and tolerating it well.        Sleep apnea Review:  There is a history of excessive daytime fatigue with associated excessive snoring at night.  There is also a history of periods of apnea at night as witnessed by a partner and family  The patient ison CPAP.  He feels and was told that weight loss would help this problem      Erectile dysfunction Review::  Patient complains of difficulty in maintaining an adequate erection.  He states that his present medication is helping thus far.        Review of Systems  Constitutional: negative for fevers, chills, anorexia and weight loss  Eyes:   negative for visual disturbance and irritation  ENT:   negative for tinnitus,sore throat,nasal congestion,ear pains.hoarseness  Respiratory:  negative for cough, hemoptysis, dyspnea,wheezing  CV:   negative for chest pain, palpitations, lower extremity edema  GI:   negative for nausea, vomiting, diarrhea, abdominal pain,melena  Endo:               negative for polyuria,polydipsia,polyphagia,heat intolerance  Genitourinary: negative for frequency, dysuria and hematuria  Integument:  negative for rash and pruritus  Hematologic:  negative for easy bruising and gum/nose bleeding  Musculoskel: negative for myalgias, arthralgias, back pain, muscle weakness, joint pain  Neurological:  negative for headaches, dizziness, vertigo, memory problems and gait   Behavl/Psych: negative for feelings of anxiety, depression, mood  changes    Past Medical History:   Diagnosis Date    Contact dermatitis and other eczema, due to unspecified cause     acne     No past surgical history on file.  Social History     Socioeconomic History    Marital status: Married     Spouse name: None    Number of children: None    Years of education: None    Highest education level: None   Tobacco Use    Smoking status: Former     Current packs/day: 0.00     Types: Cigarettes     Quit date: 10/09/1987     Years since quitting: 35.9    Smokeless tobacco: Never    Tobacco comments:     Quit smoking: smoked for 3 yrs of  and on   Substance and Sexual Activity    Alcohol use: No    Drug use: No     Social Drivers of Health     Food Insecurity: No Food Insecurity (09/15/2023)    Hunger Vital Sign     Worried About Running Out of Food in the Last Year: Never true     Ran Out of Food in the Last Year: Never true   Transportation Needs: No Transportation Needs (09/15/2023)    PRAPARE - Therapist, art (Medical): No     Lack of Transportation (  Non-Medical): No   Housing Stability: Low Risk  (09/15/2023)    Housing Stability Vital Sign     Unable to Pay for Housing in the Last Year: No     Number of Times Moved in the Last Year: 0     Homeless in the Last Year: No     Family History   Problem Relation Age of Onset    Alcohol Abuse Father     Diabetes Father     Psychiatric Disorder Mother     Hypertension Mother     Heart Disease Mother     Migraines Mother     Elevated Lipids Mother     Diabetes Mother     Alcohol Abuse Paternal Grandfather     Alcohol Abuse Paternal Grandmother     Alcohol Abuse Maternal Grandfather     Alcohol Abuse Maternal Grandmother     Stroke Father     Hypertension Father     Heart Disease Father     Elevated Lipids Father     Stroke Mother      Current Outpatient Medications   Medication Sig Dispense Refill    sildenafil  (VIAGRA ) 100 MG tablet Take 1 tablet by mouth daily as needed for Erectile Dysfunction 30 tablet 1     Semaglutide-Weight Management (WEGOVY) 0.25 MG/0.5ML SOAJ SC injection Inject 0.25 mg into the skin every 7 days 2 mL 0    tamsulosin  (FLOMAX ) 0.4 MG capsule TAKE 1 CAPSULE BY MOUTH DAILY 90 capsule 3     No current facility-administered medications for this visit.     No Known Allergies    Objective:  BP 110/74   Pulse 78   Temp 98 F (36.7 C) (Oral)   Resp 16   Ht 1.753 m (5\' 9" )   Wt 116.1 kg (256 lb)   SpO2 95%   BMI 37.80 kg/m   Physical Exam:   General appearance - alert, well appearing, and in no distress  Mental status - alert, oriented to person, place, and time  EYE-PERLA, EOMI, corneas normal, no foreign bodies  ENT-ENT exam normal, no neck nodes or sinus tenderness  Nose - normal and patent, no erythema, discharge or polyps  Mouth - mucous membranes moist, pharynx normal without lesions  Neck - supple, no significant adenopathy   Chest - clear to auscultation, no wheezes, rales or rhonchi, symmetric air entry   Heart - normal rate, regular rhythm, normal S1, S2, no murmurs, rubs, clicks or gallops   Abdomen - soft, nontender, nondistended, no masses or organomegaly  Lymph- no adenopathy palpable  Ext-peripheral pulses normal, no pedal edema, no clubbing or cyanosis  Skin-Warm and dry. no hyperpigmentation, vitiligo, or suspicious lesions  Neuro -alert, oriented, normal speech, no focal findings or movement disorder noted  Neck-normal C-spine, no tenderness, full ROM without pain  Feet-no nail deformities or callus formation with good pulses noted      No results found for this visit on 09/15/23.    Assessment/Plan:    ICD-10-CM    1. Class 2 obesity without serious comorbidity with body mass index (BMI) of 37.0 to 37.9 in adult, unspecified obesity type  E66.812 CBC    Z68.37 Comprehensive Metabolic Panel     Lipid Panel      2. Bilateral carpal tunnel syndrome  G56.03       3. Benign prostatic hyperplasia with incomplete bladder emptying  N40.1 CBC    R39.14 Comprehensive Metabolic Panel  4. Erectile disorder  N52.9         Orders Placed This Encounter   Procedures    CBC     Standing Status:   Future     Expected Date:   09/15/2023     Expiration Date:   09/14/2024    Comprehensive Metabolic Panel     Standing Status:   Future     Expected Date:   09/15/2023     Expiration Date:   09/14/2024    Lipid Panel     Standing Status:   Future     Expected Date:   09/15/2023     Expiration Date:   09/14/2024     lose weight, increase physical activity, follow low fat diet, follow low salt diet, have labs drawn prior to ROV, call if any problems,  There are no Patient Instructions on file for this visit.   Follow-up and Dispositions    Return in about 4 weeks (around 10/13/2023), or if symptoms worsen or fail to improve.           I have reviewed with the patient details of the assessment and plan and all questions were answered. Relevent patient education was performed.The most recent lab findings were reviewed with the patient.    An After Visit Summary was printed and given to the patient.

## 2023-09-15 NOTE — Addendum Note (Signed)
 Addended by: Darrick Embs on: 09/15/2023 08:14 AM     Modules accepted: Orders

## 2023-09-15 NOTE — Progress Notes (Signed)
 1. Have you been to the ER, urgent care clinic since your last visit?  Hospitalized since your last visit?No    2. Have you seen or consulted any other health care providers outside of the [facility] since your last visit?  Include any pap smears or colon screening. No     Chief Complaint   Patient presents with    Gastroesophageal Reflux    Hypertension         PHQ-9 Total Score: 0 (09/15/2023  7:47 AM)

## 2023-09-17 LAB — COMPREHENSIVE METABOLIC PANEL
ALT: 50 U/L (ref 12–78)
AST: 24 U/L (ref 15–37)
Albumin/Globulin Ratio: 1.1 (ref 1.1–2.2)
Albumin: 3.9 g/dL (ref 3.5–5.0)
Alk Phosphatase: 71 U/L (ref 45–117)
Anion Gap: 6 mmol/L (ref 2–12)
BUN/Creatinine Ratio: 21 — ABNORMAL HIGH (ref 12–20)
BUN: 21 mg/dL — ABNORMAL HIGH (ref 6–20)
CO2: 26 mmol/L (ref 21–32)
Calcium: 9.7 mg/dL (ref 8.5–10.1)
Chloride: 106 mmol/L (ref 97–108)
Creatinine: 1 mg/dL (ref 0.70–1.30)
Est, Glom Filt Rate: 86 mL/min/{1.73_m2} (ref >60)
Globulin: 3.6 g/dL (ref 2.0–4.0)
Glucose: 114 mg/dL — ABNORMAL HIGH (ref 65–100)
Potassium: 4.2 mmol/L (ref 3.5–5.1)
Sodium: 138 mmol/L (ref 136–145)
Total Bilirubin: 0.3 mg/dL (ref 0.2–1.0)
Total Protein: 7.5 g/dL (ref 6.4–8.2)

## 2023-09-17 LAB — CBC
Hematocrit: 48.2 % (ref 36.6–50.3)
Hemoglobin: 16.1 g/dL (ref 12.1–17.0)
MCH: 30 pg (ref 26.0–34.0)
MCHC: 33.4 g/dL (ref 30.0–36.5)
MCV: 89.8 FL (ref 80.0–99.0)
MPV: 10.8 FL (ref 8.9–12.9)
Nucleated RBCs: 0 /100{WBCs}
Platelets: 187 10*3/uL (ref 150–400)
RBC: 5.37 M/uL (ref 4.10–5.70)
RDW: 12.9 % (ref 11.5–14.5)
WBC: 4.5 10*3/uL (ref 4.1–11.1)
nRBC: 0 10*3/uL (ref 0.00–0.01)

## 2023-09-17 LAB — LIPID PANEL
Chol/HDL Ratio: 3 (ref 0.0–5.0)
Cholesterol, Total: 199 mg/dL (ref <200)
HDL: 66 mg/dL
LDL Cholesterol: 116.8 mg/dL — ABNORMAL HIGH (ref 0–100)
Triglycerides: 81 mg/dL (ref <150)
VLDL Cholesterol Calculated: 16.2 mg/dL

## 2023-09-23 NOTE — Telephone Encounter (Signed)
 Pt called to speak with Missy about a letter, from his insurance company about ZEPBOUND .  Pt can be reached @ [phone]

## 2023-10-13 ENCOUNTER — Ambulatory Visit: Payer: BLUE CROSS/BLUE SHIELD | Attending: Internal Medicine | Primary: Internal Medicine

## 2024-02-27 ENCOUNTER — Encounter: Payer: BLUE CROSS/BLUE SHIELD | Attending: Internal Medicine | Primary: Internal Medicine

## 2024-03-08 MED ORDER — TAMSULOSIN HCL 0.4 MG PO CAPS
0.4 | ORAL_CAPSULE | Freq: Every day | ORAL | 3 refills | Status: AC
Start: 2024-03-08 — End: ?

## 2024-03-30 ENCOUNTER — Encounter: Payer: BLUE CROSS/BLUE SHIELD | Attending: Internal Medicine | Primary: Internal Medicine
